# Patient Record
Sex: Male | Born: 1955 | Race: Black or African American | Hispanic: No | Marital: Single | Smoking: Former smoker
Health system: Southern US, Community
[De-identification: ages and names within clinical notes are randomized; demographics above are authoritative.]

## PROBLEM LIST (undated history)

## (undated) DIAGNOSIS — K219 Gastro-esophageal reflux disease without esophagitis: Secondary | ICD-10-CM

## (undated) DIAGNOSIS — J42 Unspecified chronic bronchitis: Secondary | ICD-10-CM

## (undated) DIAGNOSIS — N189 Chronic kidney disease, unspecified: Secondary | ICD-10-CM

## (undated) DIAGNOSIS — N400 Enlarged prostate without lower urinary tract symptoms: Secondary | ICD-10-CM

## (undated) DIAGNOSIS — M1990 Unspecified osteoarthritis, unspecified site: Secondary | ICD-10-CM

## (undated) DIAGNOSIS — Z973 Presence of spectacles and contact lenses: Secondary | ICD-10-CM

## (undated) DIAGNOSIS — I471 Supraventricular tachycardia, unspecified: Secondary | ICD-10-CM

## (undated) DIAGNOSIS — Z8739 Personal history of other diseases of the musculoskeletal system and connective tissue: Secondary | ICD-10-CM

## (undated) DIAGNOSIS — I491 Atrial premature depolarization: Secondary | ICD-10-CM

## (undated) DIAGNOSIS — I498 Other specified cardiac arrhythmias: Secondary | ICD-10-CM

## (undated) DIAGNOSIS — R972 Elevated prostate specific antigen [PSA]: Secondary | ICD-10-CM

## (undated) DIAGNOSIS — I10 Essential (primary) hypertension: Secondary | ICD-10-CM

## (undated) DIAGNOSIS — Z8679 Personal history of other diseases of the circulatory system: Secondary | ICD-10-CM

## (undated) DIAGNOSIS — J189 Pneumonia, unspecified organism: Secondary | ICD-10-CM

## (undated) DIAGNOSIS — N183 Chronic kidney disease, stage 3 unspecified: Secondary | ICD-10-CM

## (undated) HISTORY — PX: BACK SURGERY: SHX140

## (undated) HISTORY — PX: COLONOSCOPY: SHX174

## (undated) HISTORY — PX: TONSILLECTOMY: SUR1361

## (undated) HISTORY — PX: ROTATOR CUFF REPAIR: SHX139

## (undated) HISTORY — PX: PROSTATE BIOPSY: SHX241

## (undated) HISTORY — PX: CARDIAC ELECTROPHYSIOLOGY STUDY AND ABLATION: SHX1294

---

## 2008-04-05 HISTORY — PX: COLONOSCOPY: SHX174

## 2010-04-05 HISTORY — PX: UMBILICAL HERNIA REPAIR: SHX196

## 2012-08-14 HISTORY — PX: POSTERIOR FUSION LUMBAR SPINE: SUR632

## 2014-07-19 HISTORY — PX: CARDIAC ELECTROPHYSIOLOGY STUDY AND ABLATION: SHX1294

## 2015-04-22 HISTORY — PX: POSTERIOR FUSION LUMBAR SPINE, MINIMALLY INVASIVE: SUR633

## 2016-12-21 HISTORY — PX: SATURATION BIOPSY OF PROSTATE: SHX2375

## 2019-08-08 ENCOUNTER — Ambulatory Visit (INDEPENDENT_AMBULATORY_CARE_PROVIDER_SITE_OTHER): Payer: Self-pay

## 2019-08-08 ENCOUNTER — Ambulatory Visit
Admission: EM | Admit: 2019-08-08 | Discharge: 2019-08-08 | Disposition: A | Discharge status: Home / Self Care | Payer: Self-pay

## 2019-08-08 DIAGNOSIS — S62345A Nondisplaced fracture of base of fourth metacarpal bone, left hand, initial encounter for closed fracture: Secondary | ICD-10-CM

## 2019-08-08 DIAGNOSIS — M79645 Pain in left finger(s): Secondary | ICD-10-CM

## 2019-08-08 DIAGNOSIS — S62347A Nondisplaced fracture of base of fifth metacarpal bone. left hand, initial encounter for closed fracture: Secondary | ICD-10-CM

## 2019-08-08 DIAGNOSIS — M25561 Pain in right knee: Secondary | ICD-10-CM

## 2019-08-08 HISTORY — DX: Benign prostatic hyperplasia without lower urinary tract symptoms: N40.0

## 2019-08-08 HISTORY — DX: Gastro-esophageal reflux disease without esophagitis: K21.9

## 2019-08-08 HISTORY — DX: Essential (primary) hypertension: I10

## 2019-08-08 MED ORDER — TAMSULOSIN HCL 0.4 MG PO CAPS: 0.4000 mg | ORAL_CAPSULE | Freq: Every day | ORAL | 0 refills | Status: AC

## 2019-08-08 MED ORDER — ALBUTEROL SULFATE HFA 108 (90 BASE) MCG/ACT IN AERS: 1.0000 | INHALATION_SPRAY | Freq: Four times a day (QID) | RESPIRATORY_TRACT | 0 refills | Status: AC | PRN

## 2019-08-08 MED ORDER — LISINOPRIL 10 MG PO TABS: 10.0000 mg | ORAL_TABLET | Freq: Every day | ORAL | 0 refills | Status: DC
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
## 2019-08-08 NOTE — Discharge Instructions (Signed)
Please go to [facility] for splinting and further evaluation.  Medicines refilled, please call with verapamil dosage so I can also refill this.

## 2019-08-10 ENCOUNTER — Telehealth: Payer: Self-pay | Admitting: Emergency Medicine

## 2019-08-10 MED ORDER — VERAPAMIL HCL 120 MG PO TABS: 120.0000 mg | ORAL_TABLET | Freq: Once | ORAL | 0 refills | Status: DC

## 2019-08-10 NOTE — Telephone Encounter (Signed)
Patient called with the dose of verapamil: 240mg .  Will start at 120mg  as patient has been off of this medication greater than 3 weeks.  Pharmacy verified by nursing staff inform patient follow-up needs to be pursued with PCP for additional refills.  Patient verbalized understanding, no additional questions at this time.

## 2020-10-08 ENCOUNTER — Telehealth: Payer: Self-pay

## 2020-10-08 NOTE — Telephone Encounter (Signed)
NOTES ON FILE FROM [facility] [phone] SENT REFERRAL TO SCHEDULING

## 2020-12-04 ENCOUNTER — Other Ambulatory Visit: Payer: Self-pay | Admitting: Nephrology

## 2020-12-04 DIAGNOSIS — N1832 Chronic kidney disease, stage 3b: Secondary | ICD-10-CM

## 2020-12-11 ENCOUNTER — Ambulatory Visit
Admission: RE | Admit: 2020-12-11 | Discharge: 2020-12-11 | Disposition: A | Discharge status: Home / Self Care | Payer: Medicare Other | Source: Ambulatory Visit | Attending: Nephrology | Admitting: Nephrology

## 2020-12-11 DIAGNOSIS — N1832 Chronic kidney disease, stage 3b: Secondary | ICD-10-CM

## 2020-12-17 ENCOUNTER — Ambulatory Visit (HOSPITAL_BASED_OUTPATIENT_CLINIC_OR_DEPARTMENT_OTHER): Payer: Self-pay | Admitting: Cardiology

## 2020-12-22 ENCOUNTER — Ambulatory Visit (INDEPENDENT_AMBULATORY_CARE_PROVIDER_SITE_OTHER): Payer: Medicare Other | Admitting: Cardiology

## 2020-12-22 ENCOUNTER — Encounter: Payer: Self-pay | Admitting: Cardiology

## 2020-12-22 ENCOUNTER — Other Ambulatory Visit: Payer: Self-pay | Admitting: Nephrology

## 2020-12-22 ENCOUNTER — Other Ambulatory Visit: Payer: Self-pay

## 2020-12-22 DIAGNOSIS — N281 Cyst of kidney, acquired: Secondary | ICD-10-CM

## 2020-12-22 DIAGNOSIS — E785 Hyperlipidemia, unspecified: Secondary | ICD-10-CM | POA: Insufficient documentation

## 2020-12-22 DIAGNOSIS — N1831 Chronic kidney disease, stage 3a: Secondary | ICD-10-CM

## 2020-12-22 DIAGNOSIS — E7800 Pure hypercholesterolemia, unspecified: Secondary | ICD-10-CM | POA: Diagnosis not present

## 2020-12-22 DIAGNOSIS — I491 Atrial premature depolarization: Secondary | ICD-10-CM

## 2020-12-22 NOTE — Patient Instructions (Signed)
Medication Instructions:  The current medical regimen is effective;  continue present plan and medications.  *If you need a refill on your cardiac medications before your next appointment, please call your pharmacy*  Follow-Up: At [facility], you and your health needs are our priority.  As part of our continuing mission to provide you with exceptional heart care, we have created designated Provider Care Teams.  These Care Teams include your primary Cardiologist (physician) and Advanced Practice Providers (APPs -  Physician Assistants and Nurse Practitioners) who all work together to provide you with the care you need, when you need it.  We recommend signing up for the patient portal called "MyChart".  Sign up information is provided on this After Visit Summary.  MyChart is used to connect with patients for Virtual Visits (Telemedicine).  Patients are able to view lab/test results, encounter notes, upcoming appointments, etc.  Non-urgent messages can be sent to your provider as well.   To learn more about what you can do with MyChart, go to [email or web address].    Follow up as needed with Dr Skains  Thank you for choosing [facility]!!      

## 2020-12-22 NOTE — Assessment & Plan Note (Signed)
He is seeing nephrology.

## 2020-12-22 NOTE — Progress Notes (Signed)
Cardiology Office Note:    Date:  12/22/2020   ID:  Dylan Berg, DOB Jul 29, 1955, MRN [id number]  PCP:  Verlon Au, MD   [facility] Providers Cardiologist:  None     Referring MD: Verlon Au, MD    History of Present Illness:    Dylan Berg is a 65 y.o. male here for the evaluation of irregular heart beat at the request of Payton Emerald, MD.  EKG shows from outside office sinus rhythm with frequent PACs and bigeminy pattern normal heart rate.  He is asymptomatic with this.  He is not feeling any shortness of breath no chest pain no syncope no palpitations.  During that encounter, he had just received some bad news about one of his sisters who had died in [place].  He now resides here in [place] from [place].  He denies any caffeine use or excessive caffeine use.  He does not drink coffee.  He stopped drinking sodas.  Lab work includes creatinine of 1.76 elevated, total cholesterol 238 LDL 164 HDL 48 triglycerides 143.   Past Medical History:  Diagnosis Date   Acid reflux    Enlarged prostate    Hypertension     Past Surgical History:  Procedure Laterality Date   BACK SURGERY     ROTATOR CUFF REPAIR      Current Medications: Current Meds  Medication Sig   albuterol (VENTOLIN HFA) 108 (90 Base) MCG/ACT inhaler Inhale 1-2 puffs into the lungs every 6 (six) hours as needed for wheezing or shortness of breath.   amLODipine (NORVASC) 10 MG tablet Take 10 mg by mouth daily.   famotidine (PEPCID) 20 MG tablet Take 20 mg by mouth 2 (two) times daily.   hydrochlorothiazide (HYDRODIURIL) 25 MG tablet Take 25 mg by mouth daily.   tamsulosin (FLOMAX) 0.4 MG CAPS capsule Take 1 capsule (0.4 mg total) by mouth daily.   verapamil (CALAN) 120 MG tablet Take 1 tablet (120 mg total) by mouth once for 1 dose. Unsure dosage     Allergies:   Patient has no known allergies.   Social History   Socioeconomic History   Marital status: Single     Spouse name: Not on file   Number of children: Not on file   Years of education: Not on file   Highest education level: Not on file  Occupational History   Not on file  Tobacco Use   Smoking status: Some Days   Smokeless tobacco: Never  Substance and Sexual Activity   Alcohol use: Yes    Comment: occ   Drug use: Not Currently   Sexual activity: Yes  Other Topics Concern   Not on file  Social History Narrative   Not on file   Social Determinants of Health   Financial Resource Strain: Not on file  Food Insecurity: Not on file  Transportation Needs: Not on file  Physical Activity: Not on file  Stress: Not on file  Social Connections: Not on file     Family History: The patient's family history is negative for Heart attack. Mother bone cancer  ROS:   Please see the history of present illness.     All other systems reviewed and are negative.  EKGs/Labs/Other Studies Reviewed:    The following studies were reviewed today: Outside office note reviewed.  Outside lab work reviewed.  [place] EKG reviewed and interpreted.  EKG:  EKG is  ordered today.  The ekg ordered today demonstrates sinus rhythm 62 isolated  PAC with no other abnormalities.  Recent Labs: No results found for requested labs within last 8760 hours.  Recent Lipid Panel No results found for: CHOL, TRIG, HDL, CHOLHDL, VLDL, LDLCALC, LDLDIRECT   Risk Assessment/Calculations:          Physical Exam:    VS:  BP 112/60 (BP Location: Left Arm, Patient Position: Sitting, Cuff Size: Normal)   Pulse 62   Ht 6' (1.829 m)   Wt 220 lb (99.8 kg)   SpO2 95%   BMI 29.84 kg/m     Wt Readings from Last 3 Encounters:  12/22/20 220 lb (99.8 kg)     GEN:  Well nourished, well developed in no acute distress HEENT: Normal NECK: No JVD; No carotid bruits LYMPHATICS: No lymphadenopathy CARDIAC: RRR, no murmurs, rubs, gallops RESPIRATORY:  Clear to auscultation without rales, wheezing or rhonchi  ABDOMEN: Soft,  non-tender, non-distended MUSCULOSKELETAL:  No edema; No deformity  SKIN: Warm and dry NEUROLOGIC:  Alert and oriented x 3 PSYCHIATRIC:  Normal affect   ASSESSMENT:    1. PAC (premature atrial contraction)   2. Pure hypercholesterolemia   3. 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  Current EKG shows sinus rhythm with no significant PACs.  1 PAC noted on ECG.  Overall this is benign.  He is not having symptoms with this.  He is not have any murmurs on exam.  No prior cardiac issues.  This could have been triggered by increasing stress from his sister's death, catecholamines for instance.  He knows to avoid excessive caffeine.  Exercise.  He is non-smoker.  If symptoms worsen or become more worrisome, we can always consider monitoring him or checking echocardiogram for further structure and function.  Hyperlipidemia No early family history of coronary disease.  Continue with diet, exercise.  Could consider coronary calcium score in the future to help determine whether or not statin therapy would be beneficial for him.  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        Medication Adjustments/Labs and Tests Ordered: Current medicines are reviewed at length with the patient today.  Concerns regarding medicines are outlined above.  No orders of the defined types were placed in this encounter.  No orders of the defined types were placed in this encounter.   Patient Instructions  Medication Instructions:  The current medical regimen is effective;  continue present plan and medications.  *If you need a refill on your cardiac medications before your next appointment, please call your pharmacy*  Follow-Up: At [facility], you and your health  needs are our priority.  As part of our continuing mission to provide you with exceptional heart care, we have created designated Provider Care Teams.  These Care Teams include your primary Cardiologist (physician) and Advanced Practice Providers (APPs -  Physician Assistants and Nurse Practitioners) who all work together to provide you with the care you need, when you need it.  We recommend signing up for the patient portal called "MyChart".  Sign up information is provided on this After Visit Summary.  MyChart is used to connect with patients for Virtual Visits (Telemedicine).  Patients are able to view lab/test results, encounter notes, upcoming appointments, etc.  Non-urgent messages can be sent to  your provider as well.   To learn more about what you can do with MyChart, go to [email or web address].    Follow up as needed with Dr Anne Fu.  Thank you for choosing [facility]!!     Signed, Donato Schultz, MD  12/22/2020 11:04 AM    [facility]

## 2020-12-22 NOTE — Assessment & Plan Note (Signed)
No early family history of coronary disease.  Continue with diet, exercise.  Could consider coronary calcium score in the future to help determine whether or not statin therapy would be beneficial for him.

## 2020-12-22 NOTE — Assessment & Plan Note (Signed)
Frequent PACs were noted on his original EKG, atrial bigeminy pattern.  Current EKG shows sinus rhythm with no significant PACs.  1 PAC noted on ECG.  Overall this is benign.  He is not having symptoms with this.  He is not have any murmurs on exam.  No prior cardiac issues.  This could have been triggered by increasing stress from his sister's death, catecholamines for instance.  He knows to avoid excessive caffeine.  Exercise.  He is non-smoker.  If symptoms worsen or become more worrisome, we can always consider monitoring him or checking echocardiogram for further structure and function.

## 2021-01-11 ENCOUNTER — Other Ambulatory Visit: Payer: Medicare Other

## 2021-01-14 NOTE — Addendum Note (Signed)
Addended by: Louanne Belton, Margean Korell A on: 01/14/2021 07:28 AM   Modules accepted: Orders

## 2021-02-13 ENCOUNTER — Ambulatory Visit
Admission: RE | Admit: 2021-02-13 | Discharge: 2021-02-13 | Disposition: A | Discharge status: Home / Self Care | Payer: Medicare Other | Source: Ambulatory Visit | Attending: Nephrology | Admitting: Nephrology

## 2021-02-13 ENCOUNTER — Other Ambulatory Visit: Payer: Self-pay

## 2021-02-13 DIAGNOSIS — N281 Cyst of kidney, acquired: Secondary | ICD-10-CM

## 2021-03-06 ENCOUNTER — Other Ambulatory Visit: Payer: Self-pay

## 2021-03-06 ENCOUNTER — Ambulatory Visit
Admission: RE | Admit: 2021-03-06 | Discharge: 2021-03-06 | Disposition: A | Discharge status: Home / Self Care | Payer: Medicare Other | Source: Ambulatory Visit | Attending: Nephrology | Admitting: Nephrology

## 2021-03-06 MED ORDER — GADOBENATE DIMEGLUMINE 529 MG/ML IV SOLN
20.0000 mL | Freq: Once | INTRAVENOUS | Status: AC | PRN
  Administered 2021-03-06: 20 mL via INTRAVENOUS

## 2021-09-13 ENCOUNTER — Emergency Department (HOSPITAL_COMMUNITY)
Admission: EM | Admit: 2021-09-13 | Discharge: 2021-09-13 | Discharge status: Against Medical Advice | Payer: Medicare Other | Attending: Physician Assistant | Admitting: Physician Assistant

## 2021-09-13 ENCOUNTER — Encounter (HOSPITAL_COMMUNITY): Payer: Self-pay

## 2021-09-13 ENCOUNTER — Emergency Department (HOSPITAL_COMMUNITY): Payer: Medicare Other

## 2021-09-13 ENCOUNTER — Other Ambulatory Visit: Payer: Self-pay

## 2021-09-13 DIAGNOSIS — M25561 Pain in right knee: Secondary | ICD-10-CM | POA: Diagnosis not present

## 2021-09-13 DIAGNOSIS — W208XXA Other cause of strike by thrown, projected or falling object, initial encounter: Secondary | ICD-10-CM | POA: Insufficient documentation

## 2021-09-13 DIAGNOSIS — M25511 Pain in right shoulder: Secondary | ICD-10-CM | POA: Diagnosis not present

## 2021-09-13 DIAGNOSIS — Y9389 Activity, other specified: Secondary | ICD-10-CM | POA: Diagnosis not present

## 2021-09-13 DIAGNOSIS — Y990 Civilian activity done for income or pay: Secondary | ICD-10-CM | POA: Diagnosis not present

## 2021-09-13 DIAGNOSIS — Z5321 Procedure and treatment not carried out due to patient leaving prior to being seen by health care provider: Secondary | ICD-10-CM | POA: Insufficient documentation

## 2021-09-13 DIAGNOSIS — Y9289 Other specified places as the place of occurrence of the external cause: Secondary | ICD-10-CM | POA: Diagnosis not present

## 2021-09-13 NOTE — ED Notes (Signed)
Patient states he can no longer stay and is deciding to leave

## 2021-09-13 NOTE — ED Triage Notes (Signed)
Patient was at work and had a scaffel with sheet rock and 2x4 fall on right side.  Reports pain in right shoulder, right knee and knock noted in right bicep and having a hard time using right arm.

## 2021-10-02 ENCOUNTER — Encounter: Payer: Self-pay | Admitting: Surgery

## 2021-10-02 ENCOUNTER — Ambulatory Visit (INDEPENDENT_AMBULATORY_CARE_PROVIDER_SITE_OTHER): Payer: Worker's Compensation | Admitting: Surgery

## 2021-10-02 VITALS — BP 120/82 | HR 96 | Ht 72.0 in | Wt 220.0 lb

## 2021-10-02 DIAGNOSIS — Y990 Civilian activity done for income or pay: Secondary | ICD-10-CM

## 2021-10-02 DIAGNOSIS — M25561 Pain in right knee: Secondary | ICD-10-CM | POA: Diagnosis not present

## 2021-10-02 DIAGNOSIS — S8991XA Unspecified injury of right lower leg, initial encounter: Secondary | ICD-10-CM | POA: Diagnosis not present

## 2021-10-02 DIAGNOSIS — S4991XA Unspecified injury of right shoulder and upper arm, initial encounter: Secondary | ICD-10-CM | POA: Diagnosis not present

## 2021-10-05 ENCOUNTER — Telehealth: Payer: Self-pay | Admitting: Surgery

## 2021-10-05 NOTE — Telephone Encounter (Signed)
Pt called requesting pain medication for his right knee pains. Pt asking for pain medication to go to [facility] [address]. Please call pt at  [phone].

## 2021-10-05 NOTE — Telephone Encounter (Signed)
Please advise 

## 2021-10-13 NOTE — Progress Notes (Unsigned)
Office Visit Note   Patient: Dylan Berg           Date of Birth: [birth year]           MRN: [id number] Visit Date: 10/02/2021              Requested by: Verlon Au, MD [phone] [place],  [place] [address] PCP: Verlon Au, MD   Assessment & Plan: Visit Diagnoses:  1. Right shoulder injury, initial encounter   2. Acute pain of right knee   3. Injury of right knee, initial encounter   4. Work related injury     Plan: With patient's work-related injuries and the mechanism of injury I will schedule MRI right shoulder and MRI right knee.Marland Kitchen  He was put in a knee immobilizer.  He can be weightbearing as tolerated with crutches.  Given a note taking him out of work x2 to 3 weeks.  We will follow-up with Dr. Ophelia Charter in 2 weeks for recheck and to discuss results of the studies.  Follow-Up Instructions: Return in about 2 weeks (around 10/16/2021) for with dr yates to review mri scans.   Orders:  Orders Placed This Encounter  Procedures   MR SHOULDER RIGHT WO CONTRAST   MR Knee Right w/o contrast   No orders of the defined types were placed in this encounter.     Procedures: No procedures performed   Clinical Data: No additional findings.   Subjective: Chief Complaint  Patient presents with   Right Arm - Pain    HPI 66 year old black male comes in today with complaints of right shoulder and right knee pain.  Patient employed doing [employer] and suffered a work-related injury September 11, 2021 where he states that he was pulling a scaffold with his right arm and states that the back wheels fell off causing a sudden jerking painful motion with his right shoulder and then states that a load of sheet rock fell on top of him.  Patient immediately had pain in his right shoulder and his right knee.  Difficulty using his arm and weightbearing on the right leg.  He went to the [facility], ED September 13, 2021 and had x-rays but per their report he "left without being  seen".  I asked patient about this and he does admit to leave the emergency department without treatment.  Right shoulder x-rays showed:  EXAM: RIGHT SHOULDER - 2+ VIEW COMPARISON: None Available. FINDINGS: No evidence for an acute fracture. No evidence for shoulder separation or dislocation. Calcific tendinitis of the rotator cuff evident. Likely undersurface spur of the acromion. IMPRESSION: 1. No acute bony abnormality. 2. Calcific tendinitis of the rotator cuff. Likely undersurface spur of the acromion. Electronically Signed   Right knee x-ray:  EXAM: RIGHT KNEE - COMPLETE 4+ VIEW   COMPARISON:  None Available.   FINDINGS: No fracture of the proximal tibia or distal femur. Patella is normal. No joint effusion.   IMPRESSION: No fracture or dislocation.     Electronically Signed   By: Genevive Bi M.D.   On: 09/13/2021 17:35  Patient is he has pain in the right upper arm where he feels like there is a "knot".  Difficulty with overhead activity.  Pain when he flexes his right elbow.  Right knee is swollen and feels like it gives way.  Pain when he is weightbearing.  Is currently taking Tylenol.  Patient was also seen by his primary care provider September 22, 2021 and  referred here to our office. Review of Systems No current complaints of cardiopulmonary GI/GU  Objective: Vital Signs: BP 120/82   Pulse 96   Ht 6' (1.829 m)   Wt 220 lb (99.8 kg)   BMI 29.84 kg/m   Physical Exam HENT:     Head: Normocephalic and atraumatic.     Nose: Nose normal.  Pulmonary:     Effort: No respiratory distress.  Musculoskeletal:     Comments: Gait is antalgic.  Cervical spine unremarkable.  Right shoulder he has decreased range of motion secondary to pain.  Marked positive impingement test.  He does have a proximal biceps deformity.  Pain with resisted elbow flexion.  Negative drop arm test.  Negative logroll bilateral hips.  Right knee range of motion about 5 to 85 degrees with  pain.  Does have right knee swelling with moderate size effusion.  Medial lateral joint line tenderness.  Difficult to assess ligament stability due to knee pain.  Calf is nontender.  Neurological:     Mental Status: He is alert.     Ortho Exam  Specialty Comments:  No specialty comments available.  Imaging: No results found.   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

## 2021-10-14 ENCOUNTER — Other Ambulatory Visit: Payer: Self-pay | Admitting: Surgery

## 2021-10-14 ENCOUNTER — Telehealth: Payer: Self-pay | Admitting: Orthopaedic Surgery

## 2021-10-14 NOTE — Telephone Encounter (Signed)
Pt called requesting a script for upcoming appt for MRI pt states he need meds to keep him calm. Please send to [facility] [address]. Please call pt at [phone].

## 2021-10-15 ENCOUNTER — Other Ambulatory Visit: Payer: Self-pay | Admitting: Surgery

## 2021-10-15 MED ORDER — LORAZEPAM 0.5 MG PO TABS: ORAL_TABLET | ORAL | 0 refills | Status: DC

## 2021-10-16 NOTE — Telephone Encounter (Signed)
Tried calling to advise. No answer.  

## 2021-10-17 ENCOUNTER — Ambulatory Visit
Admission: RE | Admit: 2021-10-17 | Discharge: 2021-10-17 | Disposition: A | Discharge status: Home / Self Care | Payer: Medicare Other | Source: Ambulatory Visit | Attending: Surgery | Admitting: Surgery

## 2021-10-17 DIAGNOSIS — Y990 Civilian activity done for income or pay: Secondary | ICD-10-CM

## 2021-10-17 DIAGNOSIS — S4991XA Unspecified injury of right shoulder and upper arm, initial encounter: Secondary | ICD-10-CM

## 2021-10-17 DIAGNOSIS — S8991XA Unspecified injury of right lower leg, initial encounter: Secondary | ICD-10-CM

## 2021-11-04 ENCOUNTER — Ambulatory Visit (INDEPENDENT_AMBULATORY_CARE_PROVIDER_SITE_OTHER): Payer: Worker's Compensation | Admitting: Orthopaedic Surgery

## 2021-11-04 ENCOUNTER — Encounter: Payer: Self-pay | Admitting: Orthopaedic Surgery

## 2021-11-04 DIAGNOSIS — S46219A Strain of muscle, fascia and tendon of other parts of biceps, unspecified arm, initial encounter: Secondary | ICD-10-CM | POA: Diagnosis not present

## 2021-11-04 NOTE — Progress Notes (Signed)
Office Visit Note   Patient: Dylan Berg           Date of Birth: 1955-09-25           MRN: [id number] Visit Date: 11/04/2021              Requested by: Verlon Au, MD [id number] S. [address] [place],  [place] [address] PCP: Verlon Au, MD   Assessment & Plan: Visit Diagnoses:  1. Tear of biceps tendon     Plan: Patient had an injection in his shoulder by Dr. Darrelyn Hillock who is the treating physician for Worker's Comp.  We reviewed the MRI scan.  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  Discussed with patient he can follow-up with Dr. Darrelyn Hillock as scheduled.  We reviewed MRI of his shoulder findings of the MRI of his knee.  Currently no follow-up scheduled with me.  Follow-Up Instructions: No follow-ups on file.   Orders:  No orders of the defined types were placed in this encounter.  No orders of the defined types were placed in this encounter.     Procedures: No procedures performed   Clinical Data: No additional findings.   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  Patient's had an MRI scan which shows biceps tendon tear and patient states his biceps muscle migrated distally at the time of the scaffold falling and injuring 09/11/21.  Patient originally referred to our practice by PCP and saw my PA Zonia Kief, PA-C.  Patient was referred by Worker's Comp. to Dr. Darrelyn Hillock at [facility].  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  Subscap muscle still looks good.  He states he is having knee pain he is ambulatory with a cane.  History of stage III  kidney disease PACs hyperlipidemia.  Review of Systems all the systems noncontributory to HPI.   Objective: Vital Signs: BP 115/69   Pulse 60   Ht 6' (1.829 m)   Wt 220 lb (99.8 kg)   BMI 29.84 kg/m   Physical Exam Constitutional:      Appearance: He is well-developed.  HENT:     Head: Normocephalic and atraumatic.     Right Ear: External ear normal.     Left Ear: External ear normal.  Eyes:     Pupils: Pupils are equal, round, and reactive to light.  Neck:     Thyroid: No thyromegaly.     Trachea: No tracheal deviation.  Cardiovascular:     Rate and Rhythm: Normal rate.  Pulmonary:     Effort: Pulmonary effort is normal.     Breath sounds: No wheezing.  Abdominal:     General: Bowel sounds are normal.     Palpations: Abdomen is soft.  Musculoskeletal:     Cervical back: Neck supple.  Skin:    General: Skin is warm and dry.     Capillary Refill: Capillary refill takes less than 2 seconds.  Neurological:     Mental Status: He is alert and oriented to person, place, and time.  Psychiatric:  Behavior: Behavior normal.        Thought Content: Thought content normal.        Judgment: Judgment normal.     Ortho Exam right arm positive drop arm test positive impingement.  Distal prominence of biceps muscle on the right normal on the left.  Crepitus with right knee range of motion.  Specialty Comments:  No specialty comments available.  Imaging: Narrative & Impression  CLINICAL DATA:  Shoulder trauma.  Right shoulder pain.   EXAM: MRI OF THE RIGHT SHOULDER WITHOUT CONTRAST   TECHNIQUE: Multiplanar, multisequence MR imaging of the shoulder was performed. No intravenous contrast was administered.   COMPARISON:  Radiographs 09/13/2021   FINDINGS: Rotator cuff: Full-thickness full width rupture of the supraspinatus retracted back to the plane of the glenoid. Full-thickness, nearly full width tear of the infraspinatus tendon with only a small inferior  portion of the infraspinatus still attaching to the greater tuberosity. Most of the infraspinatus tendon is torn and retracted.   Moderate subscapularis tendinopathy.   Muscles:  Supraspinatus and infraspinatus muscular atrophy.   Biceps long head: Nonvisualization of the biceps, thought to be ruptured.   Acromioclavicular Joint: Mild spurring and mild subcortical marrow edema compatible with mild degenerative AC joint arthropathy. Type II acromion. As expected, there is fluid in the subacromial subdeltoid bursa communicating with the joint.   Glenohumeral Joint: The humeral head is subluxed superiorly against the acromion. Chondral defect posteriorly in the glenoid on image 13 series 3 with underlying subcortical marrow edema. Mild chondral thinning along the humeral head.   Labrum: Indistinct and degenerative aided posterior labrum, degenerative tearing of the labrum is not readily excluded.   Bones: No significant extra-articular osseous abnormalities identified.   Other: No supplemental non-categorized findings.   IMPRESSION: 1. Full-thickness full width retracted rupture of the supraspinatus, with full-thickness partial width tearing of the infraspinatus tendon. Only a small portion of the infraspinatus tendon inferiorly still attaches to the greater tuberosity. Atrophy of the supraspinatus and infraspinatus muscles. 2. Moderate subscapularis tendinopathy. 3. Ruptured long head of the biceps. 4. Mild degenerative AC joint arthropathy and moderate degenerative glenohumeral arthropathy. Focal osteochondral lesion posteriorly along the glenoid with overlying full-thickness chondral loss and underlying subcortical marrow edema. 5. Degenerated and indistinct posterior labrum, cannot exclude degenerative tear.     Electronically Signed   By: Gaylyn Rong M.D.   On: 10/19/2021 15:32   Narrative & Impression  CLINICAL DATA:  Right knee pain and swelling,  scaffolding fell onto the patient's knee.   EXAM: MRI OF THE RIGHT KNEE WITHOUT CONTRAST   TECHNIQUE: Multiplanar, multisequence MR imaging of the knee was performed. No intravenous contrast was administered.   COMPARISON:  Radiograph 09/13/2021   FINDINGS: MENISCI   Medial meniscus:  Unremarkable   Lateral meniscus:  Unremarkable   LIGAMENTS   Cruciates:  Unremarkable   Collaterals: Mild proximal popliteus tendinopathy, otherwise unremarkable.   CARTILAGE   Patellofemoral: Marked chondral thinning along the femoral trochlear groove with subcortical marrow edema and confluent degenerative subcortical cystic lesions laterally in the femoral trochlear groove with some degree of cortical irregularity for example on image 17-18 of series 4. There is mild chondral thinning along the posterior patellar ridge.   Medial:  Unremarkable   Lateral:  Unremarkable   Joint: Moderate knee effusion. No loose osteochondral fragment identified.   Popliteal Fossa:  Small Baker's cyst.   Extensor Mechanism:  Unremarkable   Bones: Nonspecific mild endosteal edema along the patella, without a  discrete fracture.   Other: No supplemental non-categorized findings.   IMPRESSION: 1. 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 There is some subtle endosteal edema the patella although the patellar articular cartilage is much better preserved. No acute fracture is identified. 2. Moderate knee effusion and small Baker's cyst. 3. Mild proximal popliteus tendinopathy.     Electronically Signed   By: Gaylyn Rong M.D.   On: 10/19/2021 16:01         PMFS History: Patient Active Problem List   Diagnosis Date Noted   Tear of biceps tendon 11/04/2021   PAC (premature atrial contraction) 12/22/2020   Hyperlipidemia 12/22/2020    CKD stage G3a/A1, GFR 45-59 and albumin creatinine ratio <30 mg/g (HCC) 12/22/2020   Past Medical History:  Diagnosis Date   Acid reflux    Enlarged prostate    Hypertension     Family History  Problem Relation Age of Onset   Heart attack Neg Hx     Past Surgical History:  Procedure Laterality Date   BACK SURGERY     ROTATOR CUFF REPAIR     Social History   Occupational History   Not on file  Tobacco Use   Smoking status: Some Days   Smokeless tobacco: Never  Vaping Use   Vaping Use: Never used  Substance and Sexual Activity   Alcohol use: Yes    Comment: occ   Drug use: Not Currently   Sexual activity: Yes

## 2021-12-24 ENCOUNTER — Telehealth: Payer: Self-pay | Admitting: *Deleted

## 2021-12-24 NOTE — Telephone Encounter (Signed)
Spoke with pt regarding appointment.  He will call back and schedule an in-office appointment for surgical clearance.      Pre-operative Risk Assessment    Patient Name: Dylan Berg  DOB: 02/22/1956 MRN: [id number]      Request for Surgical Clearance    Procedure:   RIGHT REVERSE TSA  Date of Surgery:  Clearance TBD                                 Surgeon:  DR. Esmond Plants Surgeon's Group or Practice Name:  [facility] Phone number:  [phone] Fax number:  [phone]   Type of Clearance Requested:   - Medical    Type of Anesthesia:  General    Additional requests/questions:    [name]   12/24/2021, 3:24 PM

## 2021-12-27 NOTE — Progress Notes (Signed)
Office Visit    Patient Name: Dylan Berg Date of Encounter: 12/27/2021  Primary Care Provider:  Drue Flirt, MD Primary Cardiologist:  None Primary Electrophysiologist: None  Chief Complaint    Dylan Berg is a 66 y.o. male with PMH of irregular heartbeat, CKD stage III and HTN who presents today for surgical clearance for right reverse TSA.  Past Medical History    Past Medical History:  Diagnosis Date   Acid reflux    Enlarged prostate    Hypertension    Past Surgical History:  Procedure Laterality Date   BACK SURGERY     ROTATOR CUFF REPAIR      Allergies  No Known Allergies  History of Present Illness    Dylan Berg  is a 66 year old male with the above mention past medical history who presents today for surgical clearance of right shoulder surgery.  Dylan Berg was seen initially by Dr. Marlou Berg on 12/2020 following referral from PCP regarding irregular heartbeat.  EKG during visit shows sinus rhythm with frequent PACs and bigeminy.  Patient was noted to be asymptomatic and denies shortness of breath, chest pain.  He denied excess caffeine in his diet was encouraged to exercise.  During visit patient had just received news that his sister had passed away.  It was unclear if this new psychologist presenting PACs and decision was made to monitor in the future with echo and coronary CT if needed.  Dylan Berg presents today alone for surgical clearance of upcoming total shoulder arthroplasty.  Since last being seen in the office patient reports he has been doing well from cardiac perspective and denies any chest pain or shortness of breath.  He is tolerating his medications without adverse effects and is staying active physically.  He does report an incident at work where a scaffolding fell on his back and right knee.  He is currently having rehabilitation for his right knee pain.  He has blood pressure today is well controlled at 130/68 and heart rate  is 65 bpm..  Patient denies chest pain, palpitations, dyspnea, PND, orthopnea, nausea, vomiting, dizziness, syncope, edema, weight gain, or early satiety.   Home Medications    Current Outpatient Medications  Medication Sig Dispense Refill   albuterol (VENTOLIN HFA) 108 (90 Base) MCG/ACT inhaler Inhale 1-2 puffs into the lungs every 6 (six) hours as needed for wheezing or shortness of breath. 18 g 0   amLODipine (NORVASC) 10 MG tablet Take 10 mg by mouth daily.     famotidine (PEPCID) 20 MG tablet Take 20 mg by mouth 2 (two) times daily.     hydrochlorothiazide (HYDRODIURIL) 25 MG tablet Take 25 mg by mouth daily.     LORazepam (ATIVAN) 0.5 MG tablet Take 1 tablet 1 hour before MRI and 1 immediately before scan if needed.  Must have a driver. 2 tablet 0   tamsulosin (FLOMAX) 0.4 MG CAPS capsule Take 1 capsule (0.4 mg total) by mouth daily. 60 capsule 0   verapamil (CALAN) 120 MG tablet Take 1 tablet (120 mg total) by mouth once for 1 dose. Unsure dosage 30 tablet 0   No current facility-administered medications for this visit.     Review of Systems  Please see the history of present illness.    (+) Right knee pain (+) Right shoulder pain  All other systems reviewed and are otherwise negative except as noted above.  Physical Exam    Wt Readings from Last 3 Encounters:  11/04/21 220 lb (99.8 kg)  10/02/21 220 lb (99.8 kg)  09/13/21 220 lb (99.8 kg)   RX:VQMGQ were no vitals filed for this visit.,There is no height or weight on file to calculate BMI.  Constitutional:      Appearance: Healthy appearance. Not in distress.  Neck:     Vascular: JVD normal.  Pulmonary:     Effort: Pulmonary effort is normal.     Breath sounds: No wheezing. No rales. Diminished in the bases Cardiovascular:     Normal rate. Regular rhythm. Normal S1. Normal S2.      Murmurs: There is no murmur.  Edema:    Peripheral edema absent.  Abdominal:     Palpations: Abdomen is soft non tender. There is no  hepatomegaly.  Skin:    General: Skin is warm and dry.  Neurological:     General: No focal deficit present.     Mental Status: Alert and oriented to person, place and time.     Cranial Nerves: Cranial nerves are intact.  EKG/LABS/Other Studies Reviewed    ECG personally reviewed by me today -sinus rhythm with sinus arrhythmia and atrial bigeminy consistent with previous EKG and rate of 65 bpm.   No results found for: "WBC", "HGB", "HCT", "MCV", "PLT" No results found for: "CREATININE", "BUN", "NA", "K", "CL", "CO2" No results found for: "ALT", "AST", "GGT", "ALKPHOS", "BILITOT" No results found for: "CHOL", "HDL", "LDLCALC", "LDLDIRECT", "TRIG", "CHOLHDL"  No results found for: "HGBA1C"  Assessment & Plan    1.  Surgical clearance: -Right shoulder arthroplasty The patient affirms he has been doing well without any new cardiac symptoms. They are able to achieve 6 METS without cardiac limitations. Therefore, based on ACC/AHA guidelines, the patient would be at acceptable risk for the planned procedure without further cardiovascular testing. The patient was advised that if he develops new symptoms prior to surgery to contact our office to arrange for a follow-up visit, and he verbalized understanding.   Dylan Berg perioperative risk of a major cardiac event is 0.4% according to the Revised Cardiac Risk Index (RCRI).  Therefore, he is at low risk for perioperative complications.   His functional capacity is good at 6.91 METs according to the Duke Activity Status Index (DASI). Recommendations: According to ACC/AHA guidelines, no further cardiovascular testing needed.  The patient may proceed to surgery at acceptable risk.   Antiplatelet and/or Anticoagulation Recommendations: Not applicable    2.  Premature atrial contractions: -During visit today showed atrial bigeminy -Patient is asymptomatic currently -We will have him wear 7-day ZIO monitor to evaluate possible atrial  arrhythmia  3.  HTN: -Patient's blood pressure today was well controlled at 130/68 -Continue HCTZ 25 mg and amlodipine 10 mg  4.  Stage III CKD: -Patient currently followed by nephrology  Disposition: Follow-up as needed     Medication Adjustments/Labs and Tests Ordered: Current medicines are reviewed at length with the patient today.  Concerns regarding medicines are outlined above.   Signed, Napoleon Form, Leodis Rains, NP 12/27/2021, 10:38 AM [facility]  Note:  This document was prepared using Dragon voice recognition software and may include unintentional dictation errors.

## 2021-12-28 ENCOUNTER — Encounter: Payer: Self-pay | Admitting: Nurse Practitioner

## 2021-12-28 ENCOUNTER — Ambulatory Visit (INDEPENDENT_AMBULATORY_CARE_PROVIDER_SITE_OTHER): Payer: Medicare Other

## 2021-12-28 ENCOUNTER — Ambulatory Visit: Payer: Medicare Other | Attending: Nurse Practitioner | Admitting: Nurse Practitioner

## 2021-12-28 VITALS — BP 130/68 | Ht 71.5 in | Wt 205.4 lb

## 2021-12-28 DIAGNOSIS — I10 Essential (primary) hypertension: Secondary | ICD-10-CM | POA: Diagnosis not present

## 2021-12-28 DIAGNOSIS — N1831 Chronic kidney disease, stage 3a: Secondary | ICD-10-CM

## 2021-12-28 DIAGNOSIS — I491 Atrial premature depolarization: Secondary | ICD-10-CM

## 2021-12-28 DIAGNOSIS — Z01810 Encounter for preprocedural cardiovascular examination: Secondary | ICD-10-CM | POA: Diagnosis not present

## 2021-12-28 NOTE — Patient Instructions (Signed)
Medication Instructions:   Your physician recommends that you continue on your current medications as directed. Please refer to the Current Medication list given to you today.   *If you need a refill on your cardiac medications before your next appointment, please call your pharmacy*   Lab Work:  None ordered.  If you have labs (blood work) drawn today and your tests are completely normal, you will receive your results only by: [place] (if you have MyChart) OR A paper copy in the mail If you have any lab test that is abnormal or we need to change your treatment, we will call you to review the results.   Testing/Procedures:  Bryn Gulling- Long Term Monitor Instructions  Your physician has requested you wear a ZIO patch monitor for 7 days.  This is a single patch monitor. [employer] supplies one patch monitor per enrollment. Additional stickers are not available. Please do not apply patch if you will be having a Nuclear Stress Test,  Echocardiogram, Cardiac CT, MRI, or Chest Xray during the period you would be wearing the  monitor. The patch cannot be worn during these tests. You cannot remove and re-apply the  ZIO XT patch monitor.  Your ZIO patch monitor will be mailed 3 day USPS to your address on file. It may take 3-5 days  to receive your monitor after you have been enrolled.  Once you have received your monitor, please review the enclosed instructions. Your monitor  has already been registered assigning a specific monitor serial # to you.  Billing and Patient Assistance Program Information  We have supplied [employer] with any of your insurance information on file for billing purposes. [employer] offers a sliding scale Patient Assistance Program for patients that do not have  insurance, or whose insurance does not completely cover the cost of the ZIO monitor.  You must apply for the Patient Assistance Program to qualify for this discounted rate.  To apply, please call [employer] at  [phone], select option 4, select option 2, ask to apply for  Patient Assistance Program. Theodore Demark will ask your household income, and how many people  are in your household. They will quote your out-of-pocket cost based on that information.  [employer] will also be able to set up a 12-month, interest-free payment plan if needed.  Applying the monitor   Shave hair from upper left chest.  Hold abrader disc by orange tab. Rub abrader in 40 strokes over the upper left chest as  indicated in your monitor instructions.  Clean area with 4 enclosed alcohol pads. Let dry.  Apply patch as indicated in monitor instructions. Patch will be placed under collarbone on left  side of chest with arrow pointing upward.  Rub patch adhesive wings for 2 minutes. Remove white label marked "1". Remove the white  label marked "2". Rub patch adhesive wings for 2 additional minutes.  While looking in a mirror, press and release button in center of patch. A small green light will  flash 3-4 times. This will be your only indicator that the monitor has been turned on.  Do not shower for the first 24 hours. You may shower after the first 24 hours.  Press the button if you feel a symptom. You will hear a small click. Record Date, Time and  Symptom in the Patient Logbook.  When you are ready to remove the patch, follow instructions on the last 2 pages of Patient  Logbook. Stick patch monitor onto the last page of Patient  Logbook.  Place Patient Logbook in the blue and white box. Use locking tab on box and tape box closed  securely. The blue and white box has prepaid postage on it. Please place it in the mailbox as  soon as possible. Your physician should have your test results approximately 7 days after the  monitor has been mailed back to [facility].  Call [place] at [phone] if you have questions regarding  your ZIO XT patch monitor. Call them immediately if you see an orange light  blinking on your  monitor.  If your monitor falls off in less than 4 days, contact our Monitor department at [phone].  If your monitor becomes loose or falls off after 4 days call [employer] at [phone] for  suggestions on securing your monitor    Follow-Up: At [facility], you and your health needs are our priority.  As part of our continuing mission to provide you with exceptional heart care, we have created designated Provider Care Teams.  These Care Teams include your primary Cardiologist (physician) and Advanced Practice Providers (APPs -  Physician Assistants and Nurse Practitioners) who all work together to provide you with the care you need, when you need it.  We recommend signing up for the patient portal called "MyChart".  Sign up information is provided on this After Visit Summary.  MyChart is used to connect with patients for Virtual Visits (Telemedicine).  Patients are able to view lab/test results, encounter notes, upcoming appointments, etc.  Non-urgent messages can be sent to your provider as well.   To learn more about what you can do with MyChart, go to [email or web address].    Your next appointment:     As needed  The format for your next appointment:   In Person  Provider:   As needed      Important Information About Sugar

## 2021-12-28 NOTE — Progress Notes (Unsigned)
Enrolled for [employer] to mail a ZIO XT long term holter monitor to the patients address on file.   Dr. Skains to read. 

## 2022-01-02 DIAGNOSIS — I491 Atrial premature depolarization: Secondary | ICD-10-CM | POA: Diagnosis not present

## 2022-02-10 ENCOUNTER — Other Ambulatory Visit: Payer: Self-pay | Admitting: Physician Assistant

## 2022-02-10 DIAGNOSIS — Z720 Tobacco use: Secondary | ICD-10-CM

## 2022-02-10 DIAGNOSIS — Z0000 Encounter for general adult medical examination without abnormal findings: Secondary | ICD-10-CM

## 2022-03-18 NOTE — H&P (Signed)
Patient's anticipated LOS is less than 2 midnights, meeting these requirements: - Younger than 74 - Lives within 1 hour of care - Has a competent adult at home to recover with post-op recover - NO history of  - Chronic pain requiring opiods  - Diabetes  - Coronary Artery Disease  - Heart failure  - Heart attack  - Stroke  - DVT/VTE  - Cardiac arrhythmia  - Respiratory Failure/COPD  - Renal failure  - Anemia  - Advanced Liver disease     Dylan Berg is an 66 y.o. male.    Chief Complaint: right shoulder pain  HPI: Pt is a 66 y.o. male complaining of right shoulder pain for multiple years. Pain had continually increased since the beginning. X-rays in the clinic show end-stage arthritic changes of the right shoulder. Pt has tried various conservative treatments which have failed to alleviate their symptoms, including injections and therapy. Various options are discussed with the patient. Risks, benefits and expectations were discussed with the patient. Patient understand the risks, benefits and expectations and wishes to proceed with surgery.   PCP:  Verlon Au, MD  D/C Plans: Home  PMH: Past Medical History:  Diagnosis Date   Acid reflux    Enlarged prostate    Hypertension     PSH: Past Surgical History:  Procedure Laterality Date   BACK SURGERY     ROTATOR CUFF REPAIR      Social History:  reports that he has been smoking cigarettes. He has never used smokeless tobacco. He reports current alcohol use. He reports that he does not currently use drugs. BMI: There is no height or weight on file to calculate BMI.  No results found for: "ALBUMIN" Diabetes: Patient does not have a diagnosis of diabetes.     Smoking Status: Social History   Tobacco Use  Smoking Status Every Day   Types: Cigarettes  Smokeless Tobacco Never   Ready to quit: Not Answered Counseling given: Not Answered  The patient has participated in a 4-week cessation program.           Allergies:  No Known Allergies  Medications: No current facility-administered medications for this encounter.   Current Outpatient Medications  Medication Sig Dispense Refill   albuterol (VENTOLIN HFA) 108 (90 Base) MCG/ACT inhaler Inhale 1-2 puffs into the lungs every 6 (six) hours as needed for wheezing or shortness of breath. 18 g 0   amLODipine (NORVASC) 10 MG tablet Take 10 mg by mouth daily.     famotidine (PEPCID) 20 MG tablet Take 20 mg by mouth 2 (two) times daily.     hydrochlorothiazide (HYDRODIURIL) 25 MG tablet Take 25 mg by mouth daily.     LORazepam (ATIVAN) 0.5 MG tablet Take 1 tablet 1 hour before MRI and 1 immediately before scan if needed.  Must have a driver. 2 tablet 0   tamsulosin (FLOMAX) 0.4 MG CAPS capsule Take 1 capsule (0.4 mg total) by mouth daily. 60 capsule 0    No results found for this or any previous visit (from the past 48 hour(s)). No results found.  ROS: Pain with rom of the right upper extremity  Physical Exam: Alert and oriented 66 y.o. male in no acute distress Cranial nerves 2-12 intact Cervical spine: full rom with no tenderness, nv intact distally Chest: active breath sounds bilaterally, no wheeze rhonchi or rales Heart: regular rate and rhythm, no murmur Abd: non tender non distended with active bowel sounds Hip is stable with rom  Right shoulder painful and weak rom  Nv intact distally No rashes or edema distally  Assessment/Plan Assessment: right shoulder cuff arthropathy  Plan:  Patient will undergo a right reverse total shoulder by Dr. Ranell Patrick at [place] Risks benefits and expectations were discussed with the patient. Patient understand risks, benefits and expectations and wishes to proceed. Preoperative templating of the joint replacement has been completed, documented, and submitted to the Operating Room personnel in order to optimize intra-operative equipment management.   Alphonsa Overall PA-C, MPAS [facility] is now [employer] [address], [address], [place], [place] [address] Phone: [phone] [email or web address] Facebook  [employer]

## 2022-03-25 NOTE — Progress Notes (Addendum)
COVID Vaccine Completed:  Yes  Date of COVID positive in last 90 days:  No  PCP - Quita Skye Cardiologist - Donato Schultz, MD  Cardiac clearance in Epic dated 12-27-21 by Robin Searing, NP  Chest x-ray - N/A EKG - 12-28-21 Epic Stress Test - 03-17-15 [facility] ECHO - 03-13-15 [facility] Cardiac Cath - N/A Pacemaker/ICD device last checked: Spinal Cord Stimulator: Long Term Monitor- 2023  Bowel Prep - N/A  Sleep Study - N/A CPAP -   Fasting Blood Sugar - N/A Checks Blood Sugar _____ times a day  Last dose of GLP1 agonist-  N/A GLP1 instructions:  N/A   Last dose of SGLT-2 inhibitors-  N/A SGLT-2 instructions: N/A  Blood Thinner Instructions:  N/A Aspirin Instructions: Last Dose:  Activity level:  Can go up a flight of stairs and perform activities of daily living without stopping and without symptoms of chest pain or shortness of breath. Able to exercise without symptoms  Anesthesia review:  SVT, hx of catheter ablaton 2016,  PACs, CKD, HTN  Patient denies shortness of breath, fever, cough and chest pain at PAT appointment  Patient verbalized understanding of instructions that were given to them at the PAT appointment. Patient was also instructed that they will need to review over the PAT instructions again at home before surgery.

## 2022-03-25 NOTE — Patient Instructions (Addendum)
SURGICAL WAITING ROOM VISITATION Patients having surgery or a procedure may have no more than 2 support people in the waiting area - these visitors may rotate.    If the patient needs to stay at the hospital during part of their recovery, the visitor guidelines for inpatient rooms apply. Pre-op nurse will coordinate an appropriate time for 1 support person to accompany patient in pre-op.  This support person may not rotate.    Please refer to the [facility] website for the visitor guidelines for Inpatients (after your surgery is over and you are in a regular room).   Due to an increase in RSV and influenza rates and associated hospitalizations, children ages 70 and under may not visit patients in [facility] hospitals.     Your procedure is scheduled on: 04-16-22   Report to [facility] Main Entrance    Report to admitting at 12:20 PM   Call this number if you have problems the morning of surgery [phone]   Do not eat food :After Midnight.   After Midnight you may have the following liquids until 11:50 AM DAY OF SURGERY  Water Non-Citrus Juices (without pulp, NO RED) Carbonated Beverages Black Coffee (NO MILK/CREAM OR CREAMERS, sugar ok)  Clear Tea (NO MILK/CREAM OR CREAMERS, sugar ok) regular and decaf                             Plain Jell-O (NO RED)                                           Fruit ices (not with fruit pulp, NO RED)                                     Popsicles (NO RED)                                                               Sports drinks like Gatorade (NO RED)                   The day of surgery:  Drink ONE (1) Pre-Surgery Clear Ensure or G2 at 11:50 AM the morning of surgery. Drink in one sitting. Do not sip.  This drink was given to you during your hospital  pre-op appointment visit. Nothing else to drink after completing the Pre-Surgery Clear Ensure or G2.          If you have questions, please contact your surgeon's  office.   FOLLOW  ANY ADDITIONAL PRE OP INSTRUCTIONS YOU RECEIVED FROM YOUR SURGEON'S OFFICE!!!     Oral Hygiene is also important to reduce your risk of infection.                                    Remember - BRUSH YOUR TEETH THE MORNING OF SURGERY WITH YOUR REGULAR TOOTHPASTE   Do NOT smoke after Midnight   Take these medicines the morning of surgery with A SIP  OF WATER:   Amlodipine  Atorvastatin  Famotidine  Tamsulosin  Tramadol if needed  Okay to use inhalers  DO NOT TAKE ANY ORAL DIABETIC MEDICATIONS DAY OF YOUR SURGERY  Bring CPAP mask and tubing day of surgery.                              You may not have any metal on your body including  jewelry, and body piercing             Do not wear lotions, powders, cologne, or deodorant              Men may shave face and neck.   Do not bring valuables to the hospital. [facility] IS NOT RESPONSIBLE   FOR VALUABLES.   Contacts, dentures or bridgework may not be worn into surgery.  DO NOT BRING YOUR HOME MEDICATIONS TO THE HOSPITAL. PHARMACY WILL DISPENSE MEDICATIONS LISTED ON YOUR MEDICATION LIST TO YOU DURING YOUR ADMISSION IN THE HOSPITAL!    Patients discharged on the day of surgery will not be allowed to drive home.  Someone NEEDS to stay with you for the first 24 hours after anesthesia.   Special Instructions: Bring a copy of your healthcare power of attorney and living will documents the day of surgery if you haven't scanned them before.              Please read over the following fact sheets you were given: IF YOU HAVE QUESTIONS ABOUT YOUR PRE-OP INSTRUCTIONS PLEASE CALL [phone] [facility]- Preparing for Total Shoulder Arthroplasty    Before surgery, you can play an important role. Because skin is not sterile, your skin needs to be as free of germs as possible. You can reduce the number of germs on your skin by using the following products. Benzoyl Peroxide Gel Reduces the number of germs present on  the skin Applied twice a day to shoulder area starting two days before surgery    ==================================================================  Please follow these instructions carefully:  BENZOYL PEROXIDE 5% GEL  Please do not use if you have an allergy to benzoyl peroxide.   If your skin becomes reddened/irritated stop using the benzoyl peroxide.  Starting two days before surgery, apply as follows: Apply benzoyl peroxide in the morning and at night. Apply after taking a shower. If you are not taking a shower clean entire shoulder front, back, and side along with the armpit with a clean wet washcloth.  Place a quarter-sized dollop on your shoulder and rub in thoroughly, making sure to cover the front, back, and side of your shoulder, along with the armpit.   2 days before ____ AM   ____ PM              1 day before ____ AM   ____ PM                         Do this twice a day for two days.  (Last application is the night before surgery, AFTER using the CHG soap as described below).  Do NOT apply benzoyl peroxide gel on the day of surgery.  If you received a COVID test during your pre-op visit  it is requested that you wear a mask when out in public, stay away from anyone that may not be feeling well and notify your surgeon if you develop symptoms. If  you test positive for Covid or have been in contact with anyone that has tested positive in the last 10 days please notify you surgeon.  [facility] - Preparing for Surgery Before surgery, you can play an important role.  Because skin is not sterile, your skin needs to be as free of germs as possible.  You can reduce the number of germs on your skin by washing with CHG (chlorahexidine gluconate) soap before surgery.  CHG is an antiseptic cleaner which kills germs and bonds with the skin to continue killing germs even after washing. Please DO NOT use if you have an allergy to CHG or antibacterial soaps.  If your skin becomes  reddened/irritated stop using the CHG and inform your nurse when you arrive at Short Stay. Do not shave (including legs and underarms) for at least 48 hours prior to the first CHG shower.  You may shave your face/neck.  Please follow these instructions carefully:  1.  Shower with CHG Soap the night before surgery and the  morning of surgery.  2.  If you choose to wash your hair, wash your hair first as usual with your normal  shampoo.  3.  After you shampoo, rinse your hair and body thoroughly to remove the shampoo.                             4.  Use CHG as you would any other liquid soap.  You can apply chg directly to the skin and wash.  Gently with a scrungie or clean washcloth.  5.  Apply the CHG Soap to your body ONLY FROM THE NECK DOWN.   Do   not use on face/ open                           Wound or open sores. Avoid contact with eyes, ears mouth and   genitals (private parts).                       Wash face,  Genitals (private parts) with your normal soap.             6.  Wash thoroughly, paying special attention to the area where your    surgery  will be performed.  7.  Thoroughly rinse your body with warm water from the neck down.  8.  DO NOT shower/wash with your normal soap after using and rinsing off the CHG Soap.                9.  Pat yourself dry with a clean towel.            10.  Wear clean pajamas.            11.  Place clean sheets on your bed the night of your first shower and do not  sleep with pets. Day of Surgery : Do not apply any lotions/deodorants the morning of surgery.  Please wear clean clothes to the hospital/surgery center.  FAILURE TO FOLLOW THESE INSTRUCTIONS MAY RESULT IN THE CANCELLATION OF YOUR SURGERY  PATIENT SIGNATURE_________________________________  NURSE SIGNATURE__________________________________  ________________________________________________________________________    Rogelia MireIncentive Spirometer  An incentive spirometer is a tool that can help  keep your lungs clear and active. This tool measures how well you are filling your lungs with each breath. Taking long deep breaths may help reverse or decrease the chance  of developing breathing (pulmonary) problems (especially infection) following: A long period of time when you are unable to move or be active. BEFORE THE PROCEDURE  If the spirometer includes an indicator to show your best effort, your nurse or respiratory therapist will set it to a desired goal. If possible, sit up straight or lean slightly forward. Try not to slouch. Hold the incentive spirometer in an upright position. INSTRUCTIONS FOR USE  Sit on the edge of your bed if possible, or sit up as far as you can in bed or on a chair. Hold the incentive spirometer in an upright position. Breathe out normally. Place the mouthpiece in your mouth and seal your lips tightly around it. Breathe in slowly and as deeply as possible, raising the piston or the ball toward the top of the column. Hold your breath for 3-5 seconds or for as long as possible. Allow the piston or ball to fall to the bottom of the column. Remove the mouthpiece from your mouth and breathe out normally. Rest for a few seconds and repeat Steps 1 through 7 at least 10 times every 1-2 hours when you are awake. Take your time and take a few normal breaths between deep breaths. The spirometer may include an indicator to show your best effort. Use the indicator as a goal to work toward during each repetition. After each set of 10 deep breaths, practice coughing to be sure your lungs are clear. If you have an incision (the cut made at the time of surgery), support your incision when coughing by placing a pillow or rolled up towels firmly against it. Once you are able to get out of bed, walk around indoors and cough well. You may stop using the incentive spirometer when instructed by your caregiver.  RISKS AND COMPLICATIONS Take your time so you do not get dizzy or  light-headed. If you are in pain, you may need to take or ask for pain medication before doing incentive spirometry. It is harder to take a deep breath if you are having pain. AFTER USE Rest and breathe slowly and easily. It can be helpful to keep track of a log of your progress. Your caregiver can provide you with a simple table to help with this. If you are using the spirometer at home, follow these instructions: SEEK MEDICAL CARE IF:  You are having difficultly using the spirometer. You have trouble using the spirometer as often as instructed. Your pain medication is not giving enough relief while using the spirometer. You develop fever of 100.5 F (38.1 C) or higher. SEEK IMMEDIATE MEDICAL CARE IF:  You cough up bloody sputum that had not been present before. You develop fever of 102 F (38.9 C) or greater. You develop worsening pain at or near the incision site. MAKE SURE YOU:  Understand these instructions. Will watch your condition. Will get help right away if you are not doing well or get worse. Document Released: 08/02/2006 Document Revised: 06/14/2011 Document Reviewed: 10/03/2006 [facility] Patient Information 2014 [place], [place].   ________________________________________________________________________

## 2022-04-06 ENCOUNTER — Encounter (HOSPITAL_COMMUNITY)
Admission: RE | Admit: 2022-04-06 | Discharge: 2022-04-06 | Disposition: A | Discharge status: Home / Self Care | Payer: Medicare Other | Source: Ambulatory Visit | Attending: Orthopedic Surgery | Admitting: Orthopedic Surgery

## 2022-04-06 ENCOUNTER — Encounter (HOSPITAL_COMMUNITY): Payer: Self-pay

## 2022-04-06 ENCOUNTER — Other Ambulatory Visit: Payer: Self-pay

## 2022-04-06 VITALS — BP 143/80 | HR 85 | Temp 98.1°F | Resp 16 | Ht 71.0 in | Wt 210.2 lb

## 2022-04-06 DIAGNOSIS — Z01818 Encounter for other preprocedural examination: Secondary | ICD-10-CM

## 2022-04-06 DIAGNOSIS — I2510 Atherosclerotic heart disease of native coronary artery without angina pectoris: Secondary | ICD-10-CM | POA: Insufficient documentation

## 2022-04-06 DIAGNOSIS — Z01812 Encounter for preprocedural laboratory examination: Secondary | ICD-10-CM | POA: Insufficient documentation

## 2022-04-06 HISTORY — DX: Chronic kidney disease, unspecified: N18.9

## 2022-04-06 HISTORY — DX: Pneumonia, unspecified organism: J18.9

## 2022-04-06 HISTORY — DX: Supraventricular tachycardia, unspecified: I47.10

## 2022-04-06 LAB — CBC
HCT: 38.4 % — ABNORMAL LOW (ref 39.0–52.0)
Hemoglobin: 12.7 g/dL — ABNORMAL LOW (ref 13.0–17.0)
MCH: 28.8 pg (ref 26.0–34.0)
MCHC: 33.1 g/dL (ref 30.0–36.0)
MCV: 87.1 fL (ref 80.0–100.0)
Platelets: 301 10*3/uL (ref 150–400)
RBC: 4.41 MIL/uL (ref 4.22–5.81)
RDW: 14.1 % (ref 11.5–15.5)
WBC: 7.4 10*3/uL (ref 4.0–10.5)
nRBC: 0 % (ref 0.0–0.2)

## 2022-04-06 LAB — BASIC METABOLIC PANEL
Anion gap: 9 (ref 5–15)
BUN: 33 mg/dL — ABNORMAL HIGH (ref 8–23)
CO2: 19 mmol/L — ABNORMAL LOW (ref 22–32)
Calcium: 8.8 mg/dL — ABNORMAL LOW (ref 8.9–10.3)
Chloride: 110 mmol/L (ref 98–111)
Creatinine, Ser: 1.49 mg/dL — ABNORMAL HIGH (ref 0.61–1.24)
GFR, Estimated: 51 mL/min — ABNORMAL LOW (ref >60)
Glucose, Bld: 95 mg/dL (ref 70–99)
Potassium: 4.5 mmol/L (ref 3.5–5.1)
Sodium: 138 mmol/L (ref 135–145)

## 2022-04-06 LAB — SURGICAL PCR SCREEN
MRSA, PCR: NEGATIVE
Staphylococcus aureus: NEGATIVE

## 2022-04-11 NOTE — Anesthesia Preprocedure Evaluation (Addendum)
Anesthesia Evaluation  Patient identified by MRN, date of birth, ID band Patient awake    Reviewed: Allergy & Precautions, NPO status , Patient's Chart, lab work & pertinent test results  History of Anesthesia Complications Negative for: history of anesthetic complications  Airway Mallampati: III  TM Distance: >3 FB Neck ROM: Full    Dental  (+) Dental Advisory Given   Pulmonary neg shortness of breath, neg sleep apnea, neg COPD, neg recent URI, former smoker   breath sounds clear to auscultation       Cardiovascular hypertension, Pt. on medications (-) angina (-) Past MI  Rhythm:Regular     Neuro/Psych negative neurological ROS  negative psych ROS   GI/Hepatic Neg liver ROS,GERD  ,,  Endo/Other  negative endocrine ROS    Renal/GU CRFRenal disease     Musculoskeletal   Abdominal   Peds  Hematology negative hematology ROS (+)   Anesthesia Other Findings   Reproductive/Obstetrics                             Anesthesia Physical Anesthesia Plan  ASA: 2  Anesthesia Plan: General and Regional   Post-op Pain Management: Regional block*   Induction: Intravenous  PONV Risk Score and Plan: 2 and Ondansetron and Dexamethasone  Airway Management Planned: Oral ETT  Additional Equipment: None  Intra-op Plan:   Post-operative Plan: Extubation in OR  Informed Consent: I have reviewed the patients History and Physical, chart, labs and discussed the procedure including the risks, benefits and alternatives for the proposed anesthesia with the patient or authorized representative who has indicated his/her understanding and acceptance.     Dental advisory given  Plan Discussed with: CRNA  Anesthesia Plan Comments: (See PAT note 04/06/2022)       Anesthesia Quick Evaluation

## 2022-04-11 NOTE — Progress Notes (Signed)
Anesthesia Chart Review   Case: [id number] Date/Time: 04/16/22 1436   Procedure: REVERSE SHOULDER ARTHROPLASTY (Right: Shoulder) - 120 min choice and general   Anesthesia type: Choice   Pre-op diagnosis: right shoulder rotator cuff tear   Location: [facility] ROOM 06 / [facility]   Surgeons: Netta Cedars, MD       DISCUSSION:67 y.o. former smoker with h/o HTN, CKD Stage III followed by nephrology, SVT, right shoulder rotator cuff tear scheduled for above procedure 04/16/22 with Dr. Netta Cedars.  H/o back surgery. Per notes, "SPINE SURGERY 04/22/2015  Minimally invasive removal of existing L4-5 pedicle screw fusion hardware/Minimally invasive L4-5 fusion exploration/Minimally invasive left L5-S1 diskectomy with nerve root decompression/Left L5-S1 minimally invasive transforaminal lumbar interbody fusion (K2M)/Minimally invasive placement of bilateral titanium non-cannulated Everest screws at S1 with fusion--Dr. Floy Berg"  Pt seen by cardiology 12/28/2021 for preoperative evaluation.  Per OV note, "The patient affirms he has been doing well without any new cardiac symptoms. They are able to achieve 6 METS without cardiac limitations. Therefore, based on ACC/AHA guidelines, the patient would be at acceptable risk for the planned procedure without further cardiovascular testing. The patient was advised that if he develops new symptoms prior to surgery to contact our office to arrange for a follow-up visit, and he verbalized understanding.    Dylan Berg perioperative risk of a major cardiac event is 0.4% according to the Revised Cardiac Risk Index (RCRI).  Therefore, he is at low risk for perioperative complications.   His functional capacity is good at 6.91 METs according to the Duke Activity Status Index (DASI). Recommendations: According to ACC/AHA guidelines, no further cardiovascular testing needed.  The patient may proceed to surgery at acceptable risk."  Labs reviewed in Care Everywhere, creatinine  appears to be baseline.  He is followed by nephrology.   Anticipate pt can proceed with planned procedure barring acute status change.   VS: BP (!) 143/80   Pulse 85   Temp 36.7 C (Oral)   Resp 16   Ht 5\' 11"  (1.803 m)   Wt 95.3 kg   SpO2 99%   BMI 29.32 kg/m   PROVIDERS: Dylan Flirt, MD is PCP    LABS: Labs reviewed: Acceptable for surgery. (all labs ordered are listed, but only abnormal results are displayed)  Labs Reviewed  BASIC METABOLIC PANEL - Abnormal; Notable for the following components:      Result Value   CO2 19 (*)    BUN 33 (*)    Creatinine, Ser 1.49 (*)    Calcium 8.8 (*)    GFR, Estimated 51 (*)    All other components within normal limits  CBC - Abnormal; Notable for the following components:   Hemoglobin 12.7 (*)    HCT 38.4 (*)    All other components within normal limits  SURGICAL PCR SCREEN     IMAGES:   EKG:   CV:  Past Medical History:  Diagnosis Date   Acid reflux    Chronic kidney disease    Enlarged prostate    Hypertension    Pneumonia    as a child   SVT (supraventricular tachycardia)     Past Surgical History:  Procedure Laterality Date   BACK SURGERY     CARDIAC ELECTROPHYSIOLOGY STUDY AND ABLATION     COLONOSCOPY     PROSTATE BIOPSY     ROTATOR CUFF REPAIR      MEDICATIONS:  albuterol (VENTOLIN HFA) 108 (90 Base) MCG/ACT inhaler   amLODipine (  NORVASC) 10 MG tablet   atorvastatin (LIPITOR) 10 MG tablet   famotidine (PEPCID) 20 MG tablet   FLOVENT HFA 110 MCG/ACT inhaler   hydrochlorothiazide (HYDRODIURIL) 25 MG tablet   LORazepam (ATIVAN) 0.5 MG tablet   tamsulosin (FLOMAX) 0.4 MG CAPS capsule   traMADol (ULTRAM) 50 MG tablet   No current facility-administered medications for this encounter.      Dylan Dylan Ward, PA-C [facility] Pre-Surgical Testing [phone]

## 2022-04-16 ENCOUNTER — Encounter (HOSPITAL_COMMUNITY)
Admission: RE | Disposition: A | Discharge status: Home / Self Care | Payer: Self-pay | Source: Home / Self Care | Attending: Orthopedic Surgery

## 2022-04-16 ENCOUNTER — Ambulatory Visit (HOSPITAL_BASED_OUTPATIENT_CLINIC_OR_DEPARTMENT_OTHER): Payer: Worker's Compensation | Admitting: Anesthesiology

## 2022-04-16 ENCOUNTER — Other Ambulatory Visit: Payer: Self-pay

## 2022-04-16 ENCOUNTER — Observation Stay (HOSPITAL_COMMUNITY)
Admission: RE | Admit: 2022-04-16 | Discharge: 2022-04-17 | Disposition: A | Discharge status: Home / Self Care | Payer: Worker's Compensation | Attending: Orthopedic Surgery | Admitting: Orthopedic Surgery

## 2022-04-16 ENCOUNTER — Observation Stay (HOSPITAL_COMMUNITY): Payer: Medicare Other

## 2022-04-16 ENCOUNTER — Encounter (HOSPITAL_COMMUNITY): Payer: Self-pay | Admitting: Orthopedic Surgery

## 2022-04-16 ENCOUNTER — Ambulatory Visit (HOSPITAL_COMMUNITY): Payer: Worker's Compensation | Admitting: Physician Assistant

## 2022-04-16 DIAGNOSIS — M75101 Unspecified rotator cuff tear or rupture of right shoulder, not specified as traumatic: Principal | ICD-10-CM | POA: Insufficient documentation

## 2022-04-16 DIAGNOSIS — F17210 Nicotine dependence, cigarettes, uncomplicated: Secondary | ICD-10-CM | POA: Diagnosis not present

## 2022-04-16 DIAGNOSIS — M12811 Other specific arthropathies, not elsewhere classified, right shoulder: Secondary | ICD-10-CM | POA: Diagnosis not present

## 2022-04-16 DIAGNOSIS — Z96611 Presence of right artificial shoulder joint: Secondary | ICD-10-CM

## 2022-04-16 DIAGNOSIS — N189 Chronic kidney disease, unspecified: Secondary | ICD-10-CM | POA: Insufficient documentation

## 2022-04-16 DIAGNOSIS — I129 Hypertensive chronic kidney disease with stage 1 through stage 4 chronic kidney disease, or unspecified chronic kidney disease: Secondary | ICD-10-CM | POA: Insufficient documentation

## 2022-04-16 DIAGNOSIS — Z79899 Other long term (current) drug therapy: Secondary | ICD-10-CM | POA: Diagnosis not present

## 2022-04-16 HISTORY — PX: REVERSE SHOULDER ARTHROPLASTY: SHX5054

## 2022-04-16 SURGERY — ARTHROPLASTY, SHOULDER, TOTAL, REVERSE
Anesthesia: Choice | Site: Shoulder | Laterality: Right

## 2022-04-16 MED ORDER — METOCLOPRAMIDE HCL 5 MG/ML IJ SOLN: 5.0000 mg | Freq: Three times a day (TID) | INTRAMUSCULAR | Status: DC | PRN

## 2022-04-16 MED ORDER — PHENYLEPHRINE HCL (PRESSORS) 10 MG/ML IV SOLN
INTRAVENOUS | Status: AC
  Filled 2022-04-16: qty 1

## 2022-04-16 MED ORDER — AMLODIPINE BESYLATE 10 MG PO TABS
10.0000 mg | ORAL_TABLET | Freq: Every day | ORAL | Status: DC
  Administered 2022-04-17: 10 mg via ORAL
  Filled 2022-04-16: qty 1

## 2022-04-16 MED ORDER — ACETAMINOPHEN 500 MG PO TABS: 1000.0000 mg | ORAL_TABLET | Freq: Once | ORAL | Status: DC | PRN

## 2022-04-16 MED ORDER — MENTHOL 3 MG MT LOZG: 1.0000 | LOZENGE | OROMUCOSAL | Status: DC | PRN

## 2022-04-16 MED ORDER — ONDANSETRON HCL 4 MG PO TABS: 4.0000 mg | ORAL_TABLET | Freq: Four times a day (QID) | ORAL | Status: DC | PRN

## 2022-04-16 MED ORDER — ROCURONIUM BROMIDE 100 MG/10ML IV SOLN
INTRAVENOUS | Status: DC | PRN
  Administered 2022-04-16: 60 mg via INTRAVENOUS

## 2022-04-16 MED ORDER — ATORVASTATIN CALCIUM 10 MG PO TABS
10.0000 mg | ORAL_TABLET | Freq: Every day | ORAL | Status: DC
  Administered 2022-04-17: 10 mg via ORAL
  Filled 2022-04-16: qty 1

## 2022-04-16 MED ORDER — HYDROCHLOROTHIAZIDE 25 MG PO TABS
25.0000 mg | ORAL_TABLET | Freq: Every day | ORAL | Status: DC
  Administered 2022-04-17: 25 mg via ORAL
  Filled 2022-04-16: qty 1

## 2022-04-16 MED ORDER — SODIUM CHLORIDE 0.9 % IV SOLN: INTRAVENOUS | Status: DC

## 2022-04-16 MED ORDER — ACETAMINOPHEN 10 MG/ML IV SOLN
INTRAVENOUS | Status: AC
  Filled 2022-04-16: qty 100

## 2022-04-16 MED ORDER — TRANEXAMIC ACID-NACL 1000-0.7 MG/100ML-% IV SOLN: 1000.0000 mg | INTRAVENOUS | Status: DC

## 2022-04-16 MED ORDER — BUPIVACAINE-EPINEPHRINE (PF) 0.5% -1:200000 IJ SOLN
INTRAMUSCULAR | Status: DC | PRN
  Administered 2022-04-16: 13 mL via PERINEURAL

## 2022-04-16 MED ORDER — ACETAMINOPHEN 325 MG PO TABS
325.0000 mg | ORAL_TABLET | Freq: Four times a day (QID) | ORAL | Status: DC | PRN
  Administered 2022-04-17: 650 mg via ORAL
  Filled 2022-04-16: qty 2

## 2022-04-16 MED ORDER — DEXMEDETOMIDINE HCL IN NACL 80 MCG/20ML IV SOLN
INTRAVENOUS | Status: AC
  Filled 2022-04-16: qty 20

## 2022-04-16 MED ORDER — METHOCARBAMOL 500 MG PO TABS: 500.0000 mg | ORAL_TABLET | Freq: Three times a day (TID) | ORAL | 1 refills | Status: AC | PRN

## 2022-04-16 MED ORDER — FENTANYL CITRATE (PF) 100 MCG/2ML IJ SOLN
INTRAMUSCULAR | Status: AC
  Filled 2022-04-16: qty 2

## 2022-04-16 MED ORDER — FAMOTIDINE 20 MG PO TABS
20.0000 mg | ORAL_TABLET | Freq: Two times a day (BID) | ORAL | Status: DC
  Administered 2022-04-16 – 2022-04-17 (×2): 20 mg via ORAL
  Filled 2022-04-16 (×2): qty 1

## 2022-04-16 MED ORDER — DOCUSATE SODIUM 100 MG PO CAPS
100.0000 mg | ORAL_CAPSULE | Freq: Two times a day (BID) | ORAL | Status: DC
  Administered 2022-04-16 – 2022-04-17 (×2): 100 mg via ORAL
  Filled 2022-04-16 (×2): qty 1

## 2022-04-16 MED ORDER — OXYCODONE-ACETAMINOPHEN 5-325 MG PO TABS: 1.0000 | ORAL_TABLET | ORAL | 0 refills | Status: DC | PRN

## 2022-04-16 MED ORDER — BUDESONIDE 0.25 MG/2ML IN SUSP
0.2500 mg | Freq: Two times a day (BID) | RESPIRATORY_TRACT | Status: DC
  Administered 2022-04-16 – 2022-04-17 (×2): 0.25 mg via RESPIRATORY_TRACT
  Filled 2022-04-16 (×2): qty 2

## 2022-04-16 MED ORDER — SODIUM CHLORIDE 0.9 % IR SOLN
Status: DC | PRN
  Administered 2022-04-16: 1000 mL

## 2022-04-16 MED ORDER — HYDROMORPHONE HCL 1 MG/ML IJ SOLN: 0.5000 mg | INTRAMUSCULAR | Status: DC | PRN

## 2022-04-16 MED ORDER — PROPOFOL 10 MG/ML IV BOLUS
INTRAVENOUS | Status: DC | PRN
  Administered 2022-04-16: 160 mg via INTRAVENOUS

## 2022-04-16 MED ORDER — ACETAMINOPHEN 10 MG/ML IV SOLN: 1000.0000 mg | Freq: Once | INTRAVENOUS | Status: DC | PRN

## 2022-04-16 MED ORDER — FENTANYL CITRATE PF 50 MCG/ML IJ SOSY
100.0000 ug | PREFILLED_SYRINGE | INTRAMUSCULAR | Status: DC
  Administered 2022-04-16: 50 ug via INTRAVENOUS
  Filled 2022-04-16: qty 2

## 2022-04-16 MED ORDER — OXYCODONE HCL 5 MG PO TABS
5.0000 mg | ORAL_TABLET | Freq: Once | ORAL | Status: AC | PRN
  Administered 2022-04-16: 5 mg via ORAL

## 2022-04-16 MED ORDER — TAMSULOSIN HCL 0.4 MG PO CAPS
0.4000 mg | ORAL_CAPSULE | Freq: Every day | ORAL | Status: DC
  Administered 2022-04-17: 0.4 mg via ORAL
  Filled 2022-04-16: qty 1

## 2022-04-16 MED ORDER — ONDANSETRON HCL 4 MG/2ML IJ SOLN: 4.0000 mg | Freq: Four times a day (QID) | INTRAMUSCULAR | Status: DC | PRN

## 2022-04-16 MED ORDER — TRANEXAMIC ACID-NACL 1000-0.7 MG/100ML-% IV SOLN
INTRAVENOUS | Status: AC
  Filled 2022-04-16: qty 100

## 2022-04-16 MED ORDER — TRAMADOL HCL 50 MG PO TABS
50.0000 mg | ORAL_TABLET | Freq: Four times a day (QID) | ORAL | Status: DC | PRN
  Administered 2022-04-16 – 2022-04-17 (×2): 50 mg via ORAL
  Filled 2022-04-16 (×2): qty 1

## 2022-04-16 MED ORDER — ONDANSETRON HCL 4 MG/2ML IJ SOLN
INTRAMUSCULAR | Status: DC | PRN
  Administered 2022-04-16: 4 mg via INTRAVENOUS

## 2022-04-16 MED ORDER — BUPIVACAINE LIPOSOME 1.3 % IJ SUSP
INTRAMUSCULAR | Status: DC | PRN
  Administered 2022-04-16: 10 mL via PERINEURAL

## 2022-04-16 MED ORDER — METHOCARBAMOL 1000 MG/10ML IJ SOLN: 500.0000 mg | Freq: Four times a day (QID) | INTRAVENOUS | Status: DC | PRN

## 2022-04-16 MED ORDER — DEXMEDETOMIDINE HCL IN NACL 80 MCG/20ML IV SOLN
INTRAVENOUS | Status: DC | PRN
  Administered 2022-04-16: 20 ug via BUCCAL

## 2022-04-16 MED ORDER — FENTANYL CITRATE (PF) 100 MCG/2ML IJ SOLN
INTRAMUSCULAR | Status: DC | PRN
  Administered 2022-04-16: 100 ug via INTRAVENOUS

## 2022-04-16 MED ORDER — DEXAMETHASONE SODIUM PHOSPHATE 10 MG/ML IJ SOLN
INTRAMUSCULAR | Status: DC | PRN
  Administered 2022-04-16: 10 mg via INTRAVENOUS

## 2022-04-16 MED ORDER — ACETAMINOPHEN 10 MG/ML IV SOLN
INTRAVENOUS | Status: DC | PRN
  Administered 2022-04-16: 1000 mg via INTRAVENOUS

## 2022-04-16 MED ORDER — CEFAZOLIN SODIUM-DEXTROSE 2-4 GM/100ML-% IV SOLN
2.0000 g | INTRAVENOUS | Status: DC
  Filled 2022-04-16: qty 100

## 2022-04-16 MED ORDER — OXYCODONE HCL 5 MG/5ML PO SOLN: 5.0000 mg | Freq: Once | ORAL | Status: AC | PRN

## 2022-04-16 MED ORDER — PHENOL 1.4 % MT LIQD: 1.0000 | OROMUCOSAL | Status: DC | PRN

## 2022-04-16 MED ORDER — METHOCARBAMOL 500 MG PO TABS
500.0000 mg | ORAL_TABLET | Freq: Four times a day (QID) | ORAL | Status: DC | PRN
  Administered 2022-04-16 – 2022-04-17 (×2): 500 mg via ORAL
  Filled 2022-04-16 (×2): qty 1

## 2022-04-16 MED ORDER — LACTATED RINGERS IV SOLN: INTRAVENOUS | Status: DC

## 2022-04-16 MED ORDER — MIDAZOLAM HCL 2 MG/2ML IJ SOLN
2.0000 mg | INTRAMUSCULAR | Status: DC
  Administered 2022-04-16: 2 mg via INTRAVENOUS
  Administered 2022-04-16: 1 mg via INTRAVENOUS
  Filled 2022-04-16: qty 2

## 2022-04-16 MED ORDER — PROPOFOL 10 MG/ML IV BOLUS
INTRAVENOUS | Status: AC
  Filled 2022-04-16: qty 20

## 2022-04-16 MED ORDER — BUPIVACAINE-EPINEPHRINE (PF) 0.5% -1:200000 IJ SOLN
INTRAMUSCULAR | Status: DC | PRN
  Administered 2022-04-16: 20 mL via PERINEURAL

## 2022-04-16 MED ORDER — ORAL CARE MOUTH RINSE: 15.0000 mL | Freq: Once | OROMUCOSAL | Status: AC

## 2022-04-16 MED ORDER — TRANEXAMIC ACID-NACL 1000-0.7 MG/100ML-% IV SOLN
INTRAVENOUS | Status: DC | PRN
  Administered 2022-04-16: 1000 mg via INTRAVENOUS

## 2022-04-16 MED ORDER — ALBUTEROL SULFATE (2.5 MG/3ML) 0.083% IN NEBU
3.0000 mL | INHALATION_SOLUTION | Freq: Four times a day (QID) | RESPIRATORY_TRACT | Status: DC | PRN
  Administered 2022-04-16: 3 mL via RESPIRATORY_TRACT
  Filled 2022-04-16: qty 3

## 2022-04-16 MED ORDER — PHENYLEPHRINE HCL-NACL 20-0.9 MG/250ML-% IV SOLN
INTRAVENOUS | Status: DC | PRN
  Administered 2022-04-16: 50 ug/min via INTRAVENOUS

## 2022-04-16 MED ORDER — MIDAZOLAM HCL 2 MG/2ML IJ SOLN
INTRAMUSCULAR | Status: AC
  Filled 2022-04-16: qty 2

## 2022-04-16 MED ORDER — POLYETHYLENE GLYCOL 3350 17 G PO PACK: 17.0000 g | PACK | Freq: Every day | ORAL | Status: DC | PRN

## 2022-04-16 MED ORDER — CEFAZOLIN SODIUM-DEXTROSE 2-3 GM-%(50ML) IV SOLR
INTRAVENOUS | Status: DC | PRN
  Administered 2022-04-16: 2 g via INTRAVENOUS

## 2022-04-16 MED ORDER — OXYCODONE HCL 5 MG PO TABS
ORAL_TABLET | ORAL | Status: AC
  Filled 2022-04-16: qty 1

## 2022-04-16 MED ORDER — LIDOCAINE HCL (CARDIAC) PF 100 MG/5ML IV SOSY
PREFILLED_SYRINGE | INTRAVENOUS | Status: DC | PRN
  Administered 2022-04-16: 100 mg via INTRAVENOUS

## 2022-04-16 MED ORDER — FENTANYL CITRATE PF 50 MCG/ML IJ SOSY
25.0000 ug | PREFILLED_SYRINGE | INTRAMUSCULAR | Status: DC | PRN
  Administered 2022-04-16: 50 ug via INTRAVENOUS

## 2022-04-16 MED ORDER — ACETAMINOPHEN 160 MG/5ML PO SOLN: 1000.0000 mg | Freq: Once | ORAL | Status: DC | PRN

## 2022-04-16 MED ORDER — BISACODYL 10 MG RE SUPP: 10.0000 mg | Freq: Every day | RECTAL | Status: DC | PRN

## 2022-04-16 MED ORDER — CEFAZOLIN SODIUM-DEXTROSE 2-4 GM/100ML-% IV SOLN
2.0000 g | Freq: Four times a day (QID) | INTRAVENOUS | Status: AC
  Administered 2022-04-16 – 2022-04-17 (×3): 2 g via INTRAVENOUS
  Filled 2022-04-16 (×3): qty 100

## 2022-04-16 MED ORDER — PHENYLEPHRINE HCL (PRESSORS) 10 MG/ML IV SOLN
INTRAVENOUS | Status: DC | PRN
  Administered 2022-04-16 (×5): 160 ug via INTRAVENOUS

## 2022-04-16 MED ORDER — METOCLOPRAMIDE HCL 5 MG PO TABS: 5.0000 mg | ORAL_TABLET | Freq: Three times a day (TID) | ORAL | Status: DC | PRN

## 2022-04-16 MED ORDER — CHLORHEXIDINE GLUCONATE 0.12 % MT SOLN
15.0000 mL | Freq: Once | OROMUCOSAL | Status: AC
  Administered 2022-04-16: 15 mL via OROMUCOSAL

## 2022-04-16 MED ORDER — ONDANSETRON HCL 4 MG PO TABS: 4.0000 mg | ORAL_TABLET | Freq: Three times a day (TID) | ORAL | 1 refills | Status: DC | PRN

## 2022-04-16 MED ORDER — FENTANYL CITRATE PF 50 MCG/ML IJ SOSY
PREFILLED_SYRINGE | INTRAMUSCULAR | Status: AC
  Filled 2022-04-16: qty 1

## 2022-04-16 MED ORDER — OXYCODONE HCL 5 MG PO TABS
5.0000 mg | ORAL_TABLET | ORAL | Status: DC | PRN
  Administered 2022-04-16 – 2022-04-17 (×3): 10 mg via ORAL
  Filled 2022-04-16 (×3): qty 2

## 2022-04-16 SURGICAL SUPPLY — 74 items
BAG COUNTER SPONGE SURGICOUNT (BAG) IMPLANT
BAG ZIPLOCK 12X15 (MISCELLANEOUS) IMPLANT
BIT DRILL 1.6MX128 (BIT) IMPLANT
BIT DRILL 170X2.5X (BIT) IMPLANT
BIT DRL 170X2.5X (BIT) ×1
BLADE SAG 18X100X1.27 (BLADE) ×1 IMPLANT
COVER BACK TABLE 60X90IN (DRAPES) ×1 IMPLANT
COVER SURGICAL LIGHT HANDLE (MISCELLANEOUS) ×1 IMPLANT
CUP HUMERAL 42 PLUS 3 (Orthopedic Implant) IMPLANT
DRAPE INCISE IOBAN 66X45 STRL (DRAPES) ×1 IMPLANT
DRAPE ORTHO SPLIT 77X108 STRL (DRAPES) ×2
DRAPE SHEET LG 3/4 BI-LAMINATE (DRAPES) ×1 IMPLANT
DRAPE SURG ORHT 6 SPLT 77X108 (DRAPES) ×2 IMPLANT
DRAPE TOP 10253 STERILE (DRAPES) ×1 IMPLANT
DRAPE U-SHAPE 47X51 STRL (DRAPES) ×1 IMPLANT
DRILL 2.5 (BIT) ×1
DRSG ADAPTIC 3X8 NADH LF (GAUZE/BANDAGES/DRESSINGS) ×1 IMPLANT
DURAPREP 26ML APPLICATOR (WOUND CARE) ×1 IMPLANT
ELECT BLADE TIP CTD 4 INCH (ELECTRODE) ×1 IMPLANT
ELECT NDL TIP 2.8 STRL (NEEDLE) ×1 IMPLANT
ELECT NEEDLE TIP 2.8 STRL (NEEDLE) ×1 IMPLANT
ELECT REM PT RETURN 15FT ADLT (MISCELLANEOUS) ×1 IMPLANT
EPIPHYSI RIGHT SZ 2 (Shoulder) ×1 IMPLANT
EPIPHYSIS RIGHT SZ 2 (Shoulder) IMPLANT
FACESHIELD WRAPAROUND (MASK) ×1 IMPLANT
FACESHIELD WRAPAROUND OR TEAM (MASK) ×1 IMPLANT
GAUZE PAD ABD 7.5X8 STRL (GAUZE/BANDAGES/DRESSINGS) IMPLANT
GAUZE PAD ABD 8X10 STRL (GAUZE/BANDAGES/DRESSINGS) ×1 IMPLANT
GAUZE SPONGE 4X4 12PLY STRL (GAUZE/BANDAGES/DRESSINGS) ×1 IMPLANT
GLENOSPHERE XTEND LAT 42+0 STD (Miscellaneous) IMPLANT
GLOVE BIOGEL PI IND STRL 7.5 (GLOVE) ×1 IMPLANT
GLOVE BIOGEL PI IND STRL 8.5 (GLOVE) ×1 IMPLANT
GLOVE ORTHO TXT STRL SZ7.5 (GLOVE) ×1 IMPLANT
GLOVE SURG ORTHO 8.5 STRL (GLOVE) ×1 IMPLANT
GOWN STRL REUS W/ TWL XL LVL3 (GOWN DISPOSABLE) ×2 IMPLANT
GOWN STRL REUS W/TWL XL LVL3 (GOWN DISPOSABLE) ×2
KIT BASIN OR (CUSTOM PROCEDURE TRAY) ×1 IMPLANT
KIT TURNOVER KIT A (KITS) IMPLANT
MANIFOLD NEPTUNE II (INSTRUMENTS) ×1 IMPLANT
METAGLENE DELTA EXTEND (Trauma) IMPLANT
METAGLENE DXTEND (Trauma) ×1 IMPLANT
NDL MAYO 6 CRC TAPER PT (NEEDLE) IMPLANT
NDL MAYO CATGUT SZ4 TPR NDL (NEEDLE) ×1 IMPLANT
NEEDLE MAYO 6 CRC TAPER PT (NEEDLE) ×1 IMPLANT
NEEDLE MAYO CATGUT SZ4 (NEEDLE) ×1 IMPLANT
NS IRRIG 1000ML POUR BTL (IV SOLUTION) ×1 IMPLANT
PACK SHOULDER (CUSTOM PROCEDURE TRAY) ×1 IMPLANT
PIN GUIDE 1.2 (PIN) IMPLANT
PIN GUIDE GLENOPHERE 1.5MX300M (PIN) IMPLANT
PIN METAGLENE 2.5 (PIN) IMPLANT
PROTECTOR NERVE ULNAR (MISCELLANEOUS) ×1 IMPLANT
RESTRAINT HEAD UNIVERSAL NS (MISCELLANEOUS) ×1 IMPLANT
SCREW 4.5X24MM (Screw) ×1 IMPLANT
SCREW 4.5X36MM (Screw) IMPLANT
SCREW BN 24X4.5XLCK STRL (Screw) IMPLANT
SCREW LOCK 42 (Screw) IMPLANT
SLING ARM FOAM STRAP LRG (SOFTGOODS) IMPLANT
SMARTMIX MINI TOWER (MISCELLANEOUS)
SPIKE FLUID TRANSFER (MISCELLANEOUS) ×1 IMPLANT
SPONGE T-LAP 4X18 ~~LOC~~+RFID (SPONGE) ×1 IMPLANT
STEM 12 HA (Stem) IMPLANT
STRIP CLOSURE SKIN 1/2X4 (GAUZE/BANDAGES/DRESSINGS) ×1 IMPLANT
SUCTION FRAZIER HANDLE 10FR (MISCELLANEOUS) ×1
SUCTION TUBE FRAZIER 10FR DISP (MISCELLANEOUS) ×1 IMPLANT
SUT FIBERWIRE #2 38 T-5 BLUE (SUTURE) ×4
SUT MNCRL AB 4-0 PS2 18 (SUTURE) ×1 IMPLANT
SUT VIC AB 0 CT1 36 (SUTURE) ×2 IMPLANT
SUT VIC AB 0 CT2 27 (SUTURE) ×1 IMPLANT
SUT VIC AB 2-0 CT1 27 (SUTURE) ×1
SUT VIC AB 2-0 CT1 TAPERPNT 27 (SUTURE) ×1 IMPLANT
SUTURE FIBERWR #2 38 T-5 BLUE (SUTURE) ×2 IMPLANT
TAPE PAPER 3X10 WHT MICROPORE (GAUZE/BANDAGES/DRESSINGS) IMPLANT
TOWEL OR 17X26 10 PK STRL BLUE (TOWEL DISPOSABLE) ×1 IMPLANT
TOWER SMARTMIX MINI (MISCELLANEOUS) IMPLANT

## 2022-04-16 NOTE — Anesthesia Procedure Notes (Signed)
Anesthesia Regional Block: Interscalene brachial plexus block   Pre-Anesthetic Checklist: , timeout performed,  Correct Patient, Correct Site, Correct Laterality,  Correct Procedure, Correct Position, site marked,  Risks and benefits discussed,  Surgical consent,  Pre-op evaluation,  At surgeon's request and post-op pain management  Laterality: Right and Upper  Prep: chloraprep       Needles:  Injection technique: Single-shot      Needle Length: 5cm  Needle Gauge: 22     Additional Needles: Arrow StimuQuik ECHO Echogenic Stimulating PNB Needle  Procedures:,,,, ultrasound used (permanent image in chart),,    Narrative:  Start time: 04/16/2022 11:44 AM End time: 04/16/2022 11:51 AM Injection made incrementally with aspirations every 5 mL.  Performed by: Personally  Anesthesiologist: Oleta Mouse, MD

## 2022-04-16 NOTE — Anesthesia Procedure Notes (Signed)
Procedure Name: Intubation Date/Time: 04/16/2022 12:44 PM  Performed by: Jonna Munro, CRNAPre-anesthesia Checklist: Patient identified, Emergency Drugs available, Suction available, Patient being monitored and Timeout performed Patient Re-evaluated:Patient Re-evaluated prior to induction Oxygen Delivery Method: Circle system utilized Preoxygenation: Pre-oxygenation with 100% oxygen Induction Type: IV induction Ventilation: Mask ventilation without difficulty Laryngoscope Size: Mac and 3 Grade View: Grade I Tube type: Oral Number of attempts: 1 Airway Equipment and Method: Stylet Placement Confirmation: ETT inserted through vocal cords under direct vision, positive ETCO2, CO2 detector and breath sounds checked- equal and bilateral Secured at: 23 cm Tube secured with: Tape Dental Injury: Teeth and Oropharynx as per pre-operative assessment

## 2022-04-16 NOTE — Op Note (Signed)
Dylan Berg, Dylan Berg MEDICAL RECORD NO: [id number] ACCOUNT NO: [id number] DATE OF BIRTH: 11-15-1955 FACILITY: [facility] LOCATION: [facility] PHYSICIAN: Doran Heater. Veverly Fells, MD  Operative Report   DATE OF PROCEDURE: 04/16/2022  PREOPERATIVE DIAGNOSES:  Right shoulder rotator cuff tear and early rotator cuff tear arthropathy.  POSTOPERATIVE DIAGNOSES:  Right shoulder rotator cuff tear and early rotator cuff tear arthropathy.  PROCEDURE PERFORMED:  Right reverse total shoulder arthroplasty using DePuy Delta Xtend prosthesis with subscapularis repair.  ATTENDING SURGEON:  Doran Heater. Veverly Fells, MD  ASSISTANT:  Charletta Cousin [place], [place], who was scrubbed during the entire procedure, and necessary for satisfactory completion of surgery.  ANESTHESIA:  General anesthesia was used plus interscalene block.  ESTIMATED BLOOD LOSS:  200 mL  FLUID REPLACEMENT:  1500 mL crystalloid.  COUNTS:  Instrument counts correct.  COMPLICATIONS:  No complications.  ANTIBIOTICS:  Perioperative antibiotics were given.  INDICATIONS:  The patient is a 67 year old male with worsening right shoulder pain and dysfunction secondary to rotator cuff tear as well as rotator cuff tear arthropathy.  The patient has failed an extended period of conservative management, desires  operative treatment in order to eliminate pain and restore function and quality of life.  Informed consent obtained.  DESCRIPTION OF PROCEDURE:  After an adequate level of anesthesia was achieved, the patient was positioned in the modified beach chair position.  Right shoulder correctly identified and sterilely prepped and draped in the usual manner.  Timeout called,  verifying correct patient, correct site. We entered the patient's shoulder using a standard deltopectoral approach, starting at the coracoid process and extending down to the anterior humeral shaft using a 10 blade scalpel.  Dissection down through  subcutaneous tissues using Bovie.  Cephalic vein was  identified and taken laterally with the deltoid, pectoralis taken medially.  Conjoined tendon identified and retracted medially.  Biceps tenodesed in situ with 0 Vicryl figure-of-eight suture x2.  We  then released the subscapularis subperiosteally off the lesser tuberosity and tagged with #2 FiberWire suture in a modified Mason-Allen suture technique for repair at the end.  We released the inferior capsule extending the shoulder and delivered the  humeral head out of the wound.  We entered the proximal humerus, which was devoid of cartilage with a 6 mm reamer, we reamed up to a size 12.  We then used the 12 mm T-handle guide and resected the head at 20 degrees of retroversion.  We then removed  excess osteophytes with a rongeur.  We subluxed the humerus posteriorly, gaining good exposure of the glenoid, removed the capsule and labrum, protecting the axillary nerve.  We then found the center point of the lower glenoid.  We did have to removal of  cartilage from the glenoid side.  Once we had that removed, we found our center point, placed our guide pin and then reamed for the metaglene baseplate down to subchondral bone.  We had that baseplate seated inferiorly and also tilted slightly  inferiorly.  We then did our peripheral hand reaming and then drilled our central peg hole.  We impacted the HA coated press-fit baseplate into position.  We then used a 36 screw inferiorly, a 42 screw superiorly and a 24 screw anteriorly.  We had three  good screws.  Baseplate was secured.  We locked the screws in position We then placed a 42+0 standard glenosphere on the baseplate and secured that with a screwdriver.  We did a finger sweep to make sure the soft tissues free and clear from  that.  We  then went to the humeral side and reamed for the 2 right metaphysis and we trialled with the 12 stem, 2 right metaphysis set on the 0 setting and placed in 20 degrees of retroversion.  With that stem in place, we selected a 42+3  poly trial and placed on  the humeral tray and reduced the shoulder.  We were happy with our soft tissue tensioning and balance and stability, felt like we could repair the subscap.  We removed the trial components.  We irrigated thoroughly, drilled holes in lesser tuberosity and  placed #2 FiberWire suture for repair.  We then used available bone graft from the humeral head and used impaction grafting technique to impact the HA coated press fit 12 stem and the HA coated 2 right metaphysis set on the 0 setting and impacted in 20  degrees of retroversion.  Excellent stem support and stability.  We then selected the real 42+3 polyethylene insert and placed on the humeral tray impacted that.  We reduced the shoulder.  Nice little pop as it reduced.  Appropriate tension on the  conjoined and no gapping with inferior pole or external rotation.  We irrigated thoroughly.  We then repaired the subscapularis anatomically back to the bone on lesser tuberosity and this did not restrict range of motion.  We had a good repair.  We  irrigated thoroughly and then repaired the deltopectoral interval with 0 Vicryl suture followed by 2-0 Vicryl for subcutaneous closure and 4-0 Monocryl for skin.  Steri-Strips applied followed by sterile dressing.  The patient tolerated surgery well.   PUS D: 04/16/2022 2:42:42 pm T: 04/16/2022 5:02:00 pm  JOB: [id number]/ [id number]

## 2022-04-16 NOTE — TOC CM/SW Note (Signed)
Transition of Care [facility] Screening Note  Patient Details  Name: Dylan Berg Date of Birth: 1955/04/22  Transition of Care [facility]) CM/SW Contact:    Sherie Don, LCSW Phone Number: 04/16/2022, 4:22 PM  Transition of Care Department [facility]) has reviewed patient and no TOC needs have been identified at this time. We will continue to monitor patient advancement through interdisciplinary progression rounds. If new patient transition needs arise, please place a TOC consult.

## 2022-04-16 NOTE — Brief Op Note (Signed)
04/16/2022  2:36 PM  PATIENT:  Dylan Berg  67 y.o. male  PRE-OPERATIVE DIAGNOSIS:  right shoulder rotator cuff tear, and rotator cuff tear arthropathy  POST-OPERATIVE DIAGNOSIS:  right shoulder rotator cuff tear, and rotator cuff tear arthropathy   PROCEDURE:  Procedure(s) with comments: REVERSE SHOULDER ARTHROPLASTY (Right) - 120 min choice and general  DePuy Delta Xtend with Subscap repair  SURGEON:  Surgeon(s) and Role:    Netta Cedars, MD - Primary  PHYSICIAN ASSISTANT:   ASSISTANTS: Ventura Bruns, PA-C   ANESTHESIA:   regional and general  EBL:  200 mL   BLOOD ADMINISTERED:none  DRAINS: none   LOCAL MEDICATIONS USED:  MARCAINE     SPECIMEN:  No Specimen  DISPOSITION OF SPECIMEN:  N/A  COUNTS:  YES  TOURNIQUET:  * No tourniquets in log *  DICTATION: .Other Dictation: Dictation Number [id number]  PLAN OF CARE: Admit for overnight observation  PATIENT DISPOSITION:  PACU - hemodynamically stable.   Delay start of Pharmacological VTE agent (>24hrs) due to surgical blood loss or risk of bleeding: not applicable

## 2022-04-16 NOTE — Interval H&P Note (Signed)
History and Physical Interval Note:  04/16/2022 11:33 AM  Dylan Berg  has presented today for surgery, with the diagnosis of right shoulder rotator cuff tear.  The various methods of treatment have been discussed with the patient and family. After consideration of risks, benefits and other options for treatment, the patient has consented to  Procedure(s) with comments: REVERSE SHOULDER ARTHROPLASTY (Right) - 120 min choice and general as a surgical intervention.  The patient's history has been reviewed, patient examined, no change in status, stable for surgery.  I have reviewed the patient's chart and labs.  Questions were answered to the patient's satisfaction.     Augustin Schooling

## 2022-04-16 NOTE — Transfer of Care (Signed)
Immediate Anesthesia Transfer of Care Note  Patient: Aydenn Gervin  Procedure(s) Performed: REVERSE SHOULDER ARTHROPLASTY (Right: Shoulder)  Patient Location: PACU  Anesthesia Type:General  Level of Consciousness: awake, alert , oriented, and patient cooperative  Airway & Oxygen Therapy: Patient Spontanous Breathing and Patient connected to face mask oxygen  Post-op Assessment: Report given to RN, Post -op Vital signs reviewed and stable, and Patient moving all extremities X 4  Post vital signs: Reviewed and stable  Last Vitals:  Vitals Value Taken Time  BP 170/93 04/16/22 1438  Temp    Pulse 43 04/16/22 1439  Resp 17 04/16/22 1439  SpO2 96 % 04/16/22 1439  Vitals shown include unvalidated device data.  Last Pain:  Vitals:   04/16/22 0916  TempSrc: Oral         Complications: No notable events documented.

## 2022-04-16 NOTE — Plan of Care (Signed)
  Problem: Pain Management: °Goal: Pain level will decrease with appropriate interventions °Outcome: Progressing °  °Problem: Coping: °Goal: Level of anxiety will decrease °Outcome: Progressing °  °Problem: Safety: °Goal: Ability to remain free from injury will improve °Outcome: Progressing °  °

## 2022-04-16 NOTE — Discharge Instructions (Signed)
Ice to the shoulder constantly.  Keep the incision covered and clean and dry for one week, then ok leave the incision open to air and to get it wet in the shower.  Do exercise as instructed several times per day.  DO NOT reach behind your back or push up out of a chair with the operative arm. Be gentle with your arm and keep it supported  Use a sling while you are up and around for comfort, may remove while seated.  Keep pillow propped behind the operative elbow.  Follow up with Dr Veverly Fells in two weeks in the office, call [phone] for app

## 2022-04-17 DIAGNOSIS — M75101 Unspecified rotator cuff tear or rupture of right shoulder, not specified as traumatic: Secondary | ICD-10-CM | POA: Diagnosis not present

## 2022-04-17 LAB — BASIC METABOLIC PANEL
Anion gap: 11 (ref 5–15)
BUN: 22 mg/dL (ref 8–23)
CO2: 21 mmol/L — ABNORMAL LOW (ref 22–32)
Calcium: 8.6 mg/dL — ABNORMAL LOW (ref 8.9–10.3)
Chloride: 104 mmol/L (ref 98–111)
Creatinine, Ser: 1.6 mg/dL — ABNORMAL HIGH (ref 0.61–1.24)
GFR, Estimated: 47 mL/min — ABNORMAL LOW (ref >60)
Glucose, Bld: 146 mg/dL — ABNORMAL HIGH (ref 70–99)
Potassium: 4.2 mmol/L (ref 3.5–5.1)
Sodium: 136 mmol/L (ref 135–145)

## 2022-04-17 LAB — HEMOGLOBIN AND HEMATOCRIT, BLOOD
HCT: 35.8 % — ABNORMAL LOW (ref 39.0–52.0)
Hemoglobin: 12 g/dL — ABNORMAL LOW (ref 13.0–17.0)

## 2022-04-17 NOTE — Plan of Care (Signed)
  Problem: Pain Management: Goal: Pain level will decrease with appropriate interventions 04/17/2022 0651 by Blase Mess, RN Outcome: Progressing 04/16/2022 2042 by Blase Mess, RN Outcome: Progressing

## 2022-04-17 NOTE — Progress Notes (Addendum)
Subjective: 1 Day Post-Op Procedure(s) (LRB): REVERSE SHOULDER ARTHROPLASTY (Right) Patient reports pain as mild to moderate.  Patient seen in rounds for Dr. Veverly Fells.    Objective: Vital signs in last 24 hours: Temp:  [97.6 F (36.4 C)-98.9 F (37.2 C)] 98.8 F (37.1 C) (01/13 0510) Pulse Rate:  [66-89] 86 (01/13 0510) Resp:  [11-19] 16 (01/13 0510) BP: (120-170)/(70-97) 137/83 (01/13 0510) SpO2:  [91 %-100 %] 97 % (01/13 0510) Weight:  [95.3 kg] 95.3 kg (01/12 0916)  Intake/Output from previous day: 01/12 0701 - 01/13 0700 In: 3237 [P.O.:960; I.V.:1827.1; IV Piggyback:449.9] Out: 650 [Urine:450; Blood:200] Intake/Output this shift: No intake/output data recorded.  Recent Labs    04/17/22 0521  HGB 12.0*   Recent Labs    04/17/22 0521  HCT 35.8*   Recent Labs    04/17/22 0521  NA 136  K 4.2  CL 104  CO2 21*  BUN 22  CREATININE 1.60*  GLUCOSE 146*  CALCIUM 8.6*   No results for input(s): "LABPT", "INR" in the last 72 hours.  Neurologically intact ABD soft Neurovascular intact No cellulitis present Compartment soft Distal radial and ulnar pulses 2+ RUE.  Bulky dressing. C/D/I.  Sensory and motor function intact in RUE.   Assessment/Plan: 1 Day Post-Op Procedure(s) (LRB): REVERSE SHOULDER ARTHROPLASTY (Right)   Patient to d/c home once cleared with PT.    Patient's anticipated LOS is less than 2 midnights, meeting these requirements: - Younger than 49 - Lives within 1 hour of care - Has a competent adult at home to recover with post-op recover - NO history of  - Chronic pain requiring opiods  - Diabetes  - Coronary Artery Disease  - Heart failure  - Heart attack  - Stroke  - DVT/VTE  - Cardiac arrhythmia  - Respiratory Failure/COPD  - Renal failure  - Anemia  - Advanced Liver disease     Charlott Rakes, PA-C 04/17/2022, 8:53 AM

## 2022-04-17 NOTE — Evaluation (Signed)
Occupational Therapy Evaluation Patient Details Name: Dylan Berg MRN: [id number] DOB: 13-Dec-1955 Today's Date: 04/17/2022   History of Present Illness Patient s/p right reverse shoulder arthroplasty   Clinical Impression   Mr. Jabin Tapp is a 67 year old man s/p shoulder replacement without functional use of right dominant upper extremity secondary to effects of surgery and interscalene block and shoulder precautions. Therapist provided education and instruction to patient in regards to exercises, precautions, positioning, donning upper extremity clothing and bathing while maintaining shoulder precautions, ice and edema management and donning/doffing sling. Patient verbalized understanding. Handouts provided. Patient has assistance of fiance at home. Patient to follow up with MD for further therapy needs.        Recommendations for follow up therapy are one component of a multi-disciplinary discharge planning process, led by the attending physician.  Recommendations may be updated based on patient status, additional functional criteria and insurance authorization.   Follow Up Recommendations  Follow physician's recommendations for discharge plan and follow up therapies     Assistance Recommended at Discharge Intermittent Supervision/Assistance  Patient can return home with the following A little help with bathing/dressing/bathroom;Assistance with cooking/housework    Functional Status Assessment  Patient has had a recent decline in their functional status and demonstrates the ability to make significant improvements in function in a reasonable and predictable amount of time.  Equipment Recommendations  None recommended by OT    Recommendations for Other Services       Precautions / Restrictions Precautions Precautions: Shoulder Type of Shoulder Precautions: No active ROM, No passive ROM Shoulder Interventions: Shoulder sling/immobilizer;Off for  dressing/bathing/exercises Precaution Booklet Issued:  (handouts) Required Braces or Orthoses: Sling Restrictions Weight Bearing Restrictions: Yes RUE Weight Bearing: Non weight bearing      Mobility Bed Mobility Overal bed mobility: Independent                  Transfers Overall transfer level: Independent                        Balance Overall balance assessment: No apparent balance deficits (not formally assessed)                                         ADL either performed or assessed with clinical judgement   ADL Overall ADL's : Needs assistance/impaired Eating/Feeding: Set up   Grooming: Modified independent   Upper Body Bathing: Moderate assistance;Sitting   Lower Body Bathing: Modified independent   Upper Body Dressing : Moderate assistance   Lower Body Dressing: Minimal assistance Lower Body Dressing Details (indicate cue type and reason): for shoe laces Toilet Transfer: Independent   Toileting- Clothing Manipulation and Hygiene: Modified independent       Functional mobility during ADLs: Modified independent       Vision Baseline Vision/History: 1 Wears glasses Patient Visual Report: No change from baseline       Perception     Praxis      Pertinent Vitals/Pain Pain Assessment Pain Assessment: Faces Faces Pain Scale: Hurts even more Pain Location: R shoulder Pain Descriptors / Indicators: Grimacing, Guarding Pain Intervention(s): Monitored during session, Patient requesting pain meds-RN notified     Hand Dominance     Extremity/Trunk Assessment Upper Extremity Assessment Upper Extremity Assessment: RUE deficits/detail RUE Deficits / Details: impaired sensation and motor control secondary to block   Lower Extremity  Assessment Lower Extremity Assessment: Overall WFL for tasks assessed   Cervical / Trunk Assessment Cervical / Trunk Assessment: Normal   Communication     Cognition Arousal/Alertness:  Awake/alert Behavior During Therapy: WFL for tasks assessed/performed Overall Cognitive Status: Within Functional Limits for tasks assessed                                       General Comments       Exercises     Shoulder Instructions Shoulder Instructions Donning/doffing shirt without moving shoulder: Patient able to independently direct caregiver Method for sponge bathing under operated UE: Patient able to independently direct caregiver Donning/doffing sling/immobilizer: Patient able to independently direct caregiver Correct positioning of sling/immobilizer: Patient able to independently direct caregiver ROM for elbow, wrist and digits of operated UE: Patient able to independently direct caregiver Sling wearing schedule (on at all times/off for ADL's): Patient able to independently direct caregiver Proper positioning of operated UE when showering: Patient able to independently direct caregiver Dressing change: Patient able to independently direct caregiver Positioning of UE while sleeping: Patient able to independently direct caregiver    Home Living Family/patient expects to be discharged to:: Private residence Living Arrangements: Spouse/significant other                                      Prior Functioning/Environment                          OT Problem List: Decreased strength;Decreased range of motion;Impaired UE functional use;Pain      OT Treatment/Interventions:      OT Goals(Current goals can be found in the care plan section) Acute Rehab OT Goals OT Goal Formulation: All assessment and education complete, DC therapy  OT Frequency:      Co-evaluation              AM-PAC OT "6 Clicks" Daily Activity     Outcome Measure Help from another person eating meals?: None Help from another person taking care of personal grooming?: None Help from another person toileting, which includes using toliet, bedpan, or urinal?:  None Help from another person bathing (including washing, rinsing, drying)?: A Little Help from another person to put on and taking off regular upper body clothing?: A Lot Help from another person to put on and taking off regular lower body clothing?: A Little 6 Click Score: 20   End of Session Nurse Communication:  (OT education complete)  Activity Tolerance: Patient tolerated treatment well Patient left: in chair;with call bell/phone within reach  OT Visit Diagnosis: Pain                Time: 4496-7591 OT Time Calculation (min): 18 min Charges:  OT General Charges $OT Visit: 1 Visit OT Evaluation $OT Eval Low Complexity: 1 Low  Gustavo Lah, OTR/L [place]  Office [phone]   Lenward Chancellor 04/17/2022, 10:53 AM

## 2022-04-17 NOTE — Discharge Summary (Addendum)
Patient ID: Dylan Berg MRN: [id number] DOB/AGE: 1956/02/29 67 y.o.  Admit date: 04/16/2022 Discharge date: 04/17/2022  Admission Diagnoses:  right shoulder cuff arthropathy   Discharge Diagnoses:  Principal Problem:   S/P shoulder replacement, right   Past Medical History:  Diagnosis Date   Acid reflux    Chronic kidney disease    Enlarged prostate    Hypertension    Pneumonia    as a child   SVT (supraventricular tachycardia)     Surgeries: Procedure(s): REVERSE SHOULDER ARTHROPLASTY on 04/16/2022   Consultants:   Discharged Condition: Improved  Hospital Course: Dylan Berg is an 67 y.o. male who was admitted 04/16/2022 for operative treatment ofS/P shoulder replacement, right. Patient has severe unremitting pain that affects sleep, daily activities, and work/hobbies. After pre-op clearance the patient was taken to the operating room on 04/16/2022 and underwent  Procedure(s): [place].    Patient was given perioperative antibiotics:  Anti-infectives (From admission, onward)    Start     Dose/Rate Route Frequency Ordered Stop   04/16/22 1800  ceFAZolin (ANCEF) IVPB 2g/100 mL premix        2 g 200 mL/hr over 30 Minutes Intravenous Every 6 hours 04/16/22 1618 04/17/22 0710   04/16/22 0915  ceFAZolin (ANCEF) IVPB 2g/100 mL premix  Status:  Discontinued        2 g 200 mL/hr over 30 Minutes Intravenous On call to O.R. 04/16/22 0905 04/16/22 1607        Patient was given sling. Patient worked with PT and was meeting their goals regarding safe ambulation  and exercises to do, and maintaining lifting precautions in sling.   Patient benefited maximally from hospital stay and there were no complications.    Recent vital signs: Patient Vitals for the past 24 hrs:  BP Temp Temp src Pulse Resp SpO2  04/17/22 0955 (!) 171/93 98.5 F (36.9 C) Oral 71 17 97 %  04/17/22 0941 -- -- -- -- -- 95 %  04/17/22 0510 137/83 98.8 F (37.1 C) Oral 86 16 97 %   04/17/22 0216 (!) 149/97 98.5 F (36.9 C) Oral 75 17 96 %  04/16/22 2333 -- -- -- -- -- 95 %  04/16/22 2118 138/80 97.6 F (36.4 C) Oral 77 17 91 %  04/16/22 1943 -- -- -- -- -- 100 %  04/16/22 1745 (!) 141/79 98.9 F (37.2 C) Oral 89 -- 97 %  04/16/22 1557 136/77 -- -- 74 11 96 %  04/16/22 1545 -- 98.9 F (37.2 C) -- -- -- --  04/16/22 1530 120/71 -- -- 74 15 91 %  04/16/22 1515 125/73 -- -- 77 16 96 %  04/16/22 1500 -- -- -- 77 -- --  04/16/22 1445 123/74 -- -- 86 18 97 %  04/16/22 1438 (!) 170/93 97.8 F (36.6 C) -- 82 14 96 %  04/16/22 1155 -- -- -- 80 18 97 %  04/16/22 1150 (!) 144/85 -- -- 81 13 94 %  04/16/22 1145 (!) 148/87 -- -- 66 19 97 %     Recent laboratory studies:  Recent Labs    04/17/22 0521  HGB 12.0*  HCT 35.8*  NA 136  K 4.2  CL 104  CO2 21*  BUN 22  CREATININE 1.60*  GLUCOSE 146*  CALCIUM 8.6*     Discharge Medications:   Allergies as of 04/17/2022   No Known Allergies      Medication List     TAKE these medications  albuterol 108 (90 Base) MCG/ACT inhaler Commonly known as: VENTOLIN HFA Inhale 1-2 puffs into the lungs every 6 (six) hours as needed for wheezing or shortness of breath.   amLODipine 10 MG tablet Commonly known as: NORVASC Take 10 mg by mouth daily.   atorvastatin 10 MG tablet Commonly known as: LIPITOR Take 10 mg by mouth daily.   famotidine 20 MG tablet Commonly known as: PEPCID Take 20 mg by mouth 2 (two) times daily.   Flovent HFA 110 MCG/ACT inhaler Generic drug: fluticasone Inhale 1 puff into the lungs in the morning and at bedtime.   hydrochlorothiazide 25 MG tablet Commonly known as: HYDRODIURIL Take 25 mg by mouth daily.   LORazepam 0.5 MG tablet Commonly known as: Ativan Take 1 tablet 1 hour before MRI and 1 immediately before scan if needed.  Must have a driver.   methocarbamol 500 MG tablet Commonly known as: ROBAXIN Take 1 tablet (500 mg total) by mouth every 8 (eight) hours as needed  for muscle spasms.   ondansetron 4 MG tablet Commonly known as: Zofran Take 1 tablet (4 mg total) by mouth every 8 (eight) hours as needed for nausea, vomiting or refractory nausea / vomiting.   oxyCODONE-acetaminophen 5-325 MG tablet Commonly known as: Percocet Take 1 tablet by mouth every 4 (four) hours as needed for severe pain or moderate pain.   tamsulosin 0.4 MG Caps capsule Commonly known as: FLOMAX Take 1 capsule (0.4 mg total) by mouth daily.   traMADol 50 MG tablet Commonly known as: ULTRAM Take 1 tablet by mouth every 6 (six) hours as needed for moderate pain.        Diagnostic Studies: DG Shoulder Right Port  Result Date: 04/16/2022 CLINICAL DATA:  Status post reverse total right shoulder arthroplasty. EXAM: RIGHT SHOULDER - 1 VIEW COMPARISON:  Right shoulder radiograph dated 09/13/2021 FINDINGS: Postsurgical changes from reverse total right shoulder arthroplasty. Hardware appears intact. IMPRESSION: Postsurgical changes from reverse total right shoulder arthroplasty. Electronically Signed   By: Agustin Cree M.D.   On: 04/16/2022 15:51    Disposition: Discharge disposition: 01-Home or Self Care       Discharge Instructions     Call MD / Call 911   Complete by: As directed    If you experience chest pain or shortness of breath, CALL 911 and be transported to the hospital emergency room.  If you develope a fever above 101 F, pus (white drainage) or increased drainage or redness at the wound, or calf pain, call your surgeon's office.   Constipation Prevention   Complete by: As directed    Drink plenty of fluids.  Prune juice may be helpful.  You may use a stool softener, such as Colace (over the counter) 100 mg twice a day.  Use MiraLax (over the counter) for constipation as needed.   Diet - low sodium heart healthy   Complete by: As directed    Discharge instructions   Complete by: As directed    No not change your dressing. Please wear your sling.   Driving  restrictions   Complete by: As directed    No driving.   Increase activity slowly as tolerated   Complete by: As directed    Ice to the shoulder constantly.  Keep the incision covered and clean and dry for one week, then ok leave the incision open to air and to get it wet in the shower.  Do exercise as instructed several times per day.  DO NOT  reach behind your back or push up out of a chair with the operative arm. Be gentle with your arm and keep it supported  Use a sling while you are up and around for comfort, may remove while seated.  Keep pillow propped behind the operative elbow.   Lifting restrictions   Complete by: As directed    No lifting.   Post-operative opioid taper instructions:   Complete by: As directed    POST-OPERATIVE OPIOID TAPER INSTRUCTIONS: It is important to wean off of your opioid medication as soon as possible. If you do not need pain medication after your surgery it is ok to stop day one. Opioids include: Codeine, Hydrocodone(Norco, Vicodin), Oxycodone(Percocet, oxycontin) and hydromorphone amongst others.  Long term and even short term use of opiods can cause: Increased pain response Dependence Constipation Depression Respiratory depression And more.  Withdrawal symptoms can include Flu like symptoms Nausea, vomiting And more Techniques to manage these symptoms Hydrate well Eat regular healthy meals Stay active Use relaxation techniques(deep breathing, meditating, yoga) Do Not substitute Alcohol to help with tapering If you have been on opioids for less than two weeks and do not have pain than it is ok to stop all together.  Plan to wean off of opioids This plan should start within one week post op of your joint replacement. Maintain the same interval or time between taking each dose and first decrease the dose.  Cut the total daily intake of opioids by one tablet each day Next start to increase the time between doses. The last dose that should  be eliminated is the evening dose.           Follow-up Information     Netta Cedars, MD. Call in 2 week(s).   Specialty: Orthopedic Surgery Why: call [phone] for appt Contact information: [address] [place] [address] [phone]                  Signed: Charlott Rakes, PA-C 04/17/2022, 10:12 AM

## 2022-04-17 NOTE — Plan of Care (Signed)
  Problem: Education: Goal: Knowledge of the prescribed therapeutic regimen will improve Outcome: Progressing   Problem: Activity: Goal: Ability to tolerate increased activity will improve Outcome: Progressing   Problem: Pain Management: Goal: Pain level will decrease with appropriate interventions Outcome: Progressing   

## 2022-04-17 NOTE — Progress Notes (Signed)
Pt alert and oriented. Surgical dressing clean, dry and intact. No questions or queries regarding discharge instructions. Belongings sent home with pt.  

## 2022-04-18 NOTE — Anesthesia Postprocedure Evaluation (Signed)
Anesthesia Post Note  Patient: Dylan Berg  Procedure(s) Performed: REVERSE SHOULDER ARTHROPLASTY (Right: Shoulder)     Patient location during evaluation: PACU Anesthesia Type: General and Regional Level of consciousness: awake and alert Pain management: pain level controlled Vital Signs Assessment: post-procedure vital signs reviewed and stable Respiratory status: spontaneous breathing, nonlabored ventilation and respiratory function stable Cardiovascular status: blood pressure returned to baseline and stable Postop Assessment: no apparent nausea or vomiting Anesthetic complications: no   No notable events documented.  Last Vitals:  Vitals:   04/17/22 0941 04/17/22 0955  BP:  (!) 171/93  Pulse:  71  Resp:  17  Temp:  36.9 C  SpO2: 95% 97%    Last Pain:  Vitals:   04/17/22 0955  TempSrc: Oral  PainSc:                  Tracie Dore

## 2022-04-19 ENCOUNTER — Encounter (HOSPITAL_COMMUNITY): Payer: Self-pay | Admitting: Orthopedic Surgery

## 2022-05-17 ENCOUNTER — Other Ambulatory Visit: Payer: Self-pay | Admitting: Urology

## 2022-05-17 DIAGNOSIS — R972 Elevated prostate specific antigen [PSA]: Secondary | ICD-10-CM

## 2022-06-08 ENCOUNTER — Ambulatory Visit
Admission: RE | Admit: 2022-06-08 | Discharge: 2022-06-08 | Disposition: A | Discharge status: Home / Self Care | Payer: 59 | Source: Ambulatory Visit | Attending: Urology | Admitting: Urology

## 2022-06-08 DIAGNOSIS — R972 Elevated prostate specific antigen [PSA]: Secondary | ICD-10-CM

## 2022-07-09 ENCOUNTER — Other Ambulatory Visit: Payer: 59

## 2022-08-02 ENCOUNTER — Ambulatory Visit
Admission: RE | Admit: 2022-08-02 | Discharge: 2022-08-02 | Disposition: A | Discharge status: Home / Self Care | Payer: 59 | Source: Ambulatory Visit | Attending: Urology | Admitting: Urology

## 2022-08-02 MED ORDER — GADOPICLENOL 0.5 MMOL/ML IV SOLN
10.0000 mL | Freq: Once | INTRAVENOUS | Status: AC | PRN
  Administered 2022-08-02: 10 mL via INTRAVENOUS

## 2022-08-11 ENCOUNTER — Other Ambulatory Visit (HOSPITAL_COMMUNITY): Payer: Self-pay | Admitting: Urology

## 2022-08-11 ENCOUNTER — Other Ambulatory Visit: Payer: Self-pay | Admitting: Urology

## 2022-08-11 DIAGNOSIS — R972 Elevated prostate specific antigen [PSA]: Secondary | ICD-10-CM

## 2022-08-12 ENCOUNTER — Encounter (HOSPITAL_BASED_OUTPATIENT_CLINIC_OR_DEPARTMENT_OTHER): Payer: Self-pay | Admitting: Urology

## 2022-08-12 NOTE — Progress Notes (Signed)
Spoke w/ via phone for pre-op interview--- pt Lab needs dos----    [employer] results------ current EKG in epic/ chart COVID test -----patient states asymptomatic no test needed Arrive at -------  0800 on 08-17-2022 NPO after MN NO Solid Food.  Clear liquids from MN until--- 0700 Med rec completed Medications to take morning of surgery ----- gabapentin, lipitor, norvasc, flomax, pepcid, flovent inhlaler Diabetic medication ----- n/a Patient instructed no nail polish to be worn day of surgery Patient instructed to bring photo id and insurance card day of surgery Patient aware to have Driver (ride ) / caregiver    for 24 hours after surgery -- sig other, teon Patient Special Instructions ----- will do one fleet enema morning of surgery Pre-Op special Instructions ----- n/a Patient verbalized understanding of instructions that were given at this phone interview. Patient denies shortness of breath, chest pain, fever, cough at this phone interview.   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 Roxan Hockey Cardiologist : Dr Anne Fu [facility] 12-28-2021) Chest x-ray : no EKG : 12-28-2021 Echo : 03-13-2015  [facility] Stress test:  nuclear 03-17-2015 [facility] Monitor:  01-19-2022 epic Cardiac Cath :  no Activity level: denies sob w/ any activity

## 2022-08-16 ENCOUNTER — Encounter (HOSPITAL_BASED_OUTPATIENT_CLINIC_OR_DEPARTMENT_OTHER): Payer: Self-pay | Admitting: Urology

## 2022-08-16 NOTE — Progress Notes (Signed)
Spoke w/ via phone for pre-op interview--- Dylan Berg  Lab needs dos---- ISTAT              Lab results------Current EKG in chart and in Epic COVID test -----patient states asymptomatic no test needed Arrive at -------0800 NPO after MN NO Solid Food.  Clear liquids from MN until---0700 Med rec completed Medications to take morning of surgery -----Gabapentin, Lipitor, Norvasc, Flomax, Pepcid and Flovent  Diabetic medication ----- Patient instructed no nail polish to be worn day of surgery Patient instructed to bring photo id and insurance card day of surgery Patient aware to have Driver (ride ) / caregiver  Dylan Berg fiance  for 24 hours after surgery  Patient Special Instructions ----- FLEETS enema AM of surgery. Pre-Op special Instructions ----- Patient verbalized understanding of instructions that were given at this phone interview. Patient denies shortness of breath, chest pain, fever, cough at this phone interview.

## 2022-08-17 ENCOUNTER — Ambulatory Visit (HOSPITAL_COMMUNITY): Payer: 59

## 2022-08-17 DIAGNOSIS — Z01818 Encounter for other preprocedural examination: Secondary | ICD-10-CM

## 2022-08-23 NOTE — Progress Notes (Signed)
Patient notified of time change from 0800 to 0730. Clear liquids until 0630

## 2022-08-23 NOTE — H&P (Signed)
PSA of 9.8 on 10/02/2020 at PCP office. Does have history of BPH managed with tamsulosin 0.4 mg daily and also takes super beta prostate over-the-counter. Had been seen by urologist in the [place] area in 2019 and underwent what sounds like a transperineal prostate biopsy in 2019 which he states was negative for cancer. I do not have any other records nor do I have PSA values from there. Patient is not aware of any family history of prostate cancer. The patient has minimal voiding symptoms when taking tamsulosin.  Post void residuals 31 cc  Micro urinalysis is clear on urine spun sediment  -10/28/21-patient with history of elevated PSA underwent negative prostate biopsy in the [place] area in 2019. PSA in January 2023 was 8.14. Patient came by and got PSA drawn earlier in the week but had to redraw due to lab difficulty. Current level not back from yesterday. Other urologic issues have some mild ED issues where he had difficulty achieving and maintaining erections over the last several months. He has tried generic sildenafil in the past with good success. He takes no nitroglycerin products  Micro urinalysis clear on urine spun sediment  Postvoid residual equals: 78 cc   -05/10/22-patient with history of elevated PSA with negative prostate biopsy in the [place] area in 2019 by report. PSA in January 23 was 8.14. Repeat value on 10/27/2021 was 6.03. We opted for surveillance. His most recent PSA is now 10.80 on 05/05/2022. No significant voiding issues. Patient does desire refill on sildenafil for erectile dysfunction as this works well for him.  Micro urinalysis clear on urine spun sediment  -08/09/22-patient with history of elevated PSA with negative prostate biopsy in the [place] area in 2019 by report. Most recent PSA up to 10.80 on 05/05/2022. Opted for MRI of the prostate. Here to discuss results. MRI of the prostate showed prostamegaly with volume of 53 cc. No high-grade or intermediate lesions noted.     CLINICAL DATA: Elevated PSA level of 10.8 on 05/05/2022. Reportedly  negative/benign biopsy [place] state.   EXAM:  MR PROSTATE WITHOUT AND WITH CONTRAST   TECHNIQUE:  Multiplanar multisequence MRI images were obtained of the pelvis  centered about the prostate. Pre and post contrast images were  obtained.   CONTRAST: 10 cc Vueway   COMPARISON: None Available.   FINDINGS:  Prostate: Encapsulated nodularity in the transition zone compatible  with benign prostatic hypertrophy.   Hazy low T2 signal in the peripheral zone is nonfocal, likely  postinflammatory, and is considered PI-RADS category 2.   No focal lesion of intermediate or higher suspicion for prostate  cancer is identified. No significant abnormal restriction of  diffusion.   Volume: Ellipsoid volume calculation: 5.2 by 4.0 by 4.9 cm (volume =  53 cm^3)   Transcapsular spread: Absent   Seminal vesicle involvement: Absent   Neurovascular bundle involvement: Absent   Pelvic adenopathy: Absent   Bone metastasis: Absent   Other findings: No other significant findings.   IMPRESSION:  1. No focal lesion of intermediate or higher suspicion for prostate  cancer is identified.  2. Prostatomegaly and benign prostatic hypertrophy.    Electronically Signed  By: Gaylyn Rong M.D.  On: 08/03/2022 09:05      ALLERGIES: No Allergies    MEDICATIONS: Tamsulosin Hcl 0.4 mg capsule 1 capsule PO Daily  Valium 10 mg tablet Take 1 tab po 1 hour prior to procedure  Albuterol Sulfate Hfa 90 mcg hfa aerosol with adapter 1 PO Daily  Amlodipine Besylate 10 mg tablet 1 tablet PO Daily  Famotidine 20 mg tablet 1 tablet PO Daily  Hydrochlorothiazide 25 mg tablet 1 tablet PO Daily  Proair Hfa 90 mcg hfa aerosol with adapter 1 PO Daily  Sildenafil Citrate 20 mg tablet Take 1-5 tabs po daily prn ED  Sildenafil Citrate 20 mg tablet Take 1-5 tabs po daily prn ED  Tamsulosin Hcl 0.4 mg capsule 1 capsule PO Daily   Verapamil Er 120 mg tablet, extended release 1 tablet PO Daily     GU PSH: Biopsy Prostate     NON-GU PSH: Anesth, Shoulder Replacement, Right Back surgery     GU PMH: BPH w/LUTS - 05/10/2022, - 10/28/2021, - 04/09/2021 ED due to arterial insufficiency - 05/10/2022, - 10/28/2021 Elevated PSA - 05/10/2022, - 10/28/2021, - 04/09/2021 Weak Urinary Stream - 05/10/2022, - 10/28/2021, - 04/09/2021    NON-GU PMH: Gout Hypertension    FAMILY HISTORY: No Family History    SOCIAL HISTORY: Marital Status: Single Ethnicity: Not Hispanic Or Latino; Race: Black or African American Current Smoking Status: Patient smokes. Has smoked since 04/05/2009. Smokes 1 pack per day.   Tobacco Use Assessment Completed: Used Tobacco in last 30 days? Drinks 2 drinks per day.  Does not use drugs. Does not drink caffeine.    REVIEW OF SYSTEMS:    GU Review Male:   Patient denies frequent urination, hard to postpone urination, burning/ pain with urination, get up at night to urinate, leakage of urine, stream starts and stops, trouble starting your stream, have to strain to urinate , erection problems, and penile pain.  Gastrointestinal (Upper):   Patient denies vomiting, nausea, and indigestion/ heartburn.  Gastrointestinal (Lower):   Patient denies diarrhea and constipation.  Constitutional:   Patient denies fever, night sweats, weight loss, and fatigue.  Skin:   Patient denies skin rash/ lesion and itching.  Eyes:   Patient denies blurred vision and double vision.  Ears/ Nose/ Throat:   Patient denies sore throat and sinus problems.  Hematologic/Lymphatic:   Patient denies swollen glands and easy bruising.  Cardiovascular:   Patient denies leg swelling and chest pains.  Respiratory:   Patient denies cough and shortness of breath.  Endocrine:   Patient denies excessive thirst.  Musculoskeletal:   Patient denies back pain and joint pain.  Neurological:   Patient denies headaches and dizziness.  Psychologic:    Patient denies depression and anxiety.   VITAL SIGNS: None   Complexity of Data:   05/05/22 10/27/21 04/09/21  PSA  Total PSA 10.80 ng/mL 6.03 ng/mL 8.14 ng/mL  Free PSA  1.53 ng/mL 1.38 ng/mL  % Free PSA  25 % PSA 17 % PSA    PROCEDURES:          Urinalysis w/Scope Dipstick Dipstick Cont'd Micro  Color: Straw Bilirubin: Neg mg/dL WBC/hpf: 0 - 5/hpf  Appearance: Clear Ketones: Trace mg/dL RBC/hpf: 0 - 2/hpf  Specific Gravity: 1.015 Blood: Neg ery/uL Bacteria: Rare (0-9/hpf)  pH: <=5.0 Protein: Neg mg/dL Cystals: NS (Not Seen)  Glucose: Neg mg/dL Urobilinogen: 0.2 mg/dL Casts: NS (Not Seen)    Nitrites: Neg Trichomonas: Not Present    Leukocyte Esterase: Trace leu/uL Mucous: Present      Epithelial Cells: NS (Not Seen)      Yeast: NS (Not Seen)      Sperm: Not Present    ASSESSMENT:      ICD-10 Details  1 GU:   Elevated PSA - R97.20 Acute, Complicated Injury  2  ED due to arterial insufficiency - N52.01 Chronic, Stable  3   BPH w/LUTS - N40.1 Chronic, Stable  4   Weak Urinary Stream - R39.12 Chronic, Stable     PLAN:            Medications New Meds: Sildenafil Citrate 100 mg tablet 1 tablet PO Daily PRN   #30  6 Refill(s)  Pharmacy Name:  [id number]  Address:  [address]   [place], [place] [id number]  Phone:  [phone]  Fax:  [phone]            Document Letter(s):  Created for Patient: Clinical Summary         Notes:   MRI results showing no obvious high-grade prostate lesions. I think he needs to have transrectal ultrasound and prostate biopsy with random biopsies. Patient is unable to tolerate exam with index finger and therefore would not be able to tolerate transrectal biopsy here under local anesthesia. Would recommend doing this under at least MAC anesthesia at the outpatient surgical center. Risk and benefits procedure were discussed as outlined below. Will schedule accordingly in the near future. Prostate biopsy consent:  The patient and I discussed the risks, benefits and alternatives to prostate biopsy. The risks of prostate biopsy can include but are not limited to the following: Blood in the urine, stool or ejaculate for several days to months potentially severe infection(sepsis), in up to 4% of patients, discomfort or pain and urinary retention. Postprocedure lead the patient has been instructed to go to the emergency room or call the clinic if he experiences any flu-like symptoms including generalized malaise, fevers, chills, nausea or vomiting. We discussed the small risk of erectile dysfunction long-term. He he is asked to stop all anticoagulation medications 1 week prior to the biopsy. We also talked about the pathology processing taking about 1 week and I will call the patient with the pathology results at that point. We have provided the patient with an instruction sheet and preprocedural antibiotics. I have asked him to pick up a Fleet's enema over-the-counter and use approximately 1 hour prior to his procedure. After complete discussion he has consented to the procedure. He will come back at the next available appointment time for his biopsy.

## 2022-08-24 ENCOUNTER — Ambulatory Visit (HOSPITAL_BASED_OUTPATIENT_CLINIC_OR_DEPARTMENT_OTHER): Payer: 59

## 2022-08-24 ENCOUNTER — Encounter (HOSPITAL_BASED_OUTPATIENT_CLINIC_OR_DEPARTMENT_OTHER)
Admission: RE | Disposition: A | Discharge status: Home / Self Care | Payer: Self-pay | Source: Home / Self Care | Attending: Urology

## 2022-08-24 ENCOUNTER — Ambulatory Visit (HOSPITAL_COMMUNITY)
Admission: RE | Admit: 2022-08-24 | Discharge: 2022-08-24 | Disposition: A | Discharge status: Home / Self Care | Payer: 59 | Source: Ambulatory Visit | Attending: Urology | Admitting: Urology

## 2022-08-24 ENCOUNTER — Ambulatory Visit (HOSPITAL_BASED_OUTPATIENT_CLINIC_OR_DEPARTMENT_OTHER)
Admission: RE | Admit: 2022-08-24 | Discharge: 2022-08-24 | Disposition: A | Discharge status: Home / Self Care | Payer: 59 | Attending: Urology | Admitting: Urology

## 2022-08-24 ENCOUNTER — Encounter (HOSPITAL_BASED_OUTPATIENT_CLINIC_OR_DEPARTMENT_OTHER): Payer: Self-pay | Admitting: Urology

## 2022-08-24 DIAGNOSIS — C61 Malignant neoplasm of prostate: Secondary | ICD-10-CM | POA: Insufficient documentation

## 2022-08-24 DIAGNOSIS — Z79899 Other long term (current) drug therapy: Secondary | ICD-10-CM | POA: Diagnosis not present

## 2022-08-24 DIAGNOSIS — R972 Elevated prostate specific antigen [PSA]: Secondary | ICD-10-CM

## 2022-08-24 DIAGNOSIS — F17210 Nicotine dependence, cigarettes, uncomplicated: Secondary | ICD-10-CM | POA: Diagnosis not present

## 2022-08-24 DIAGNOSIS — N5201 Erectile dysfunction due to arterial insufficiency: Secondary | ICD-10-CM | POA: Insufficient documentation

## 2022-08-24 DIAGNOSIS — I10 Essential (primary) hypertension: Secondary | ICD-10-CM | POA: Diagnosis not present

## 2022-08-24 DIAGNOSIS — Z87891 Personal history of nicotine dependence: Secondary | ICD-10-CM

## 2022-08-24 DIAGNOSIS — N400 Enlarged prostate without lower urinary tract symptoms: Secondary | ICD-10-CM | POA: Diagnosis present

## 2022-08-24 DIAGNOSIS — Z01818 Encounter for other preprocedural examination: Secondary | ICD-10-CM

## 2022-08-24 HISTORY — DX: Chronic kidney disease, stage 3 unspecified: N18.30

## 2022-08-24 HISTORY — DX: Presence of spectacles and contact lenses: Z97.3

## 2022-08-24 HISTORY — DX: Other specified cardiac arrhythmias: I49.8

## 2022-08-24 HISTORY — DX: Personal history of other diseases of the circulatory system: Z86.79

## 2022-08-24 HISTORY — DX: Unspecified chronic bronchitis: J42

## 2022-08-24 HISTORY — DX: Atrial premature depolarization: I49.1

## 2022-08-24 HISTORY — DX: Benign prostatic hyperplasia without lower urinary tract symptoms: N40.0

## 2022-08-24 HISTORY — DX: Benign prostatic hyperplasia without lower urinary tract symptoms: R97.20

## 2022-08-24 HISTORY — DX: Unspecified osteoarthritis, unspecified site: M19.90

## 2022-08-24 HISTORY — PX: PROSTATE BIOPSY: SHX241

## 2022-08-24 HISTORY — DX: Personal history of other diseases of the musculoskeletal system and connective tissue: Z87.39

## 2022-08-24 HISTORY — DX: Gastro-esophageal reflux disease without esophagitis: K21.9

## 2022-08-24 LAB — POCT I-STAT, CHEM 8
BUN: 38 mg/dL — ABNORMAL HIGH (ref 8–23)
Calcium, Ion: 1.27 mmol/L (ref 1.15–1.40)
Chloride: 112 mmol/L — ABNORMAL HIGH (ref 98–111)
Creatinine, Ser: 1.8 mg/dL — ABNORMAL HIGH (ref 0.61–1.24)
Glucose, Bld: 90 mg/dL (ref 70–99)
HCT: 39 % (ref 39.0–52.0)
Hemoglobin: 13.3 g/dL (ref 13.0–17.0)
Potassium: 5.2 mmol/L — ABNORMAL HIGH (ref 3.5–5.1)
Sodium: 141 mmol/L (ref 135–145)
TCO2: 22 mmol/L (ref 22–32)

## 2022-08-24 SURGERY — BIOPSY, PROSTATE, RECTAL APPROACH, WITH US GUIDANCE
Anesthesia: Monitor Anesthesia Care | Site: Rectum

## 2022-08-24 MED ORDER — FENTANYL CITRATE (PF) 100 MCG/2ML IJ SOLN
25.0000 ug | INTRAMUSCULAR | Status: DC | PRN
  Administered 2022-08-24: 25 ug via INTRAVENOUS

## 2022-08-24 MED ORDER — ONDANSETRON HCL 4 MG/2ML IJ SOLN: 4.0000 mg | Freq: Once | INTRAMUSCULAR | Status: DC | PRN

## 2022-08-24 MED ORDER — LIDOCAINE HCL 2 % IJ SOLN
INTRAMUSCULAR | Status: DC | PRN
  Administered 2022-08-24: 10 mL

## 2022-08-24 MED ORDER — CIPROFLOXACIN IN D5W 400 MG/200ML IV SOLN
400.0000 mg | INTRAVENOUS | Status: AC
  Administered 2022-08-24: 400 mg via INTRAVENOUS

## 2022-08-24 MED ORDER — PROPOFOL 500 MG/50ML IV EMUL
INTRAVENOUS | Status: AC
  Filled 2022-08-24: qty 50

## 2022-08-24 MED ORDER — CIPROFLOXACIN IN D5W 400 MG/200ML IV SOLN
INTRAVENOUS | Status: AC
  Filled 2022-08-24: qty 200

## 2022-08-24 MED ORDER — MIDAZOLAM HCL 5 MG/5ML IJ SOLN
INTRAMUSCULAR | Status: DC | PRN
  Administered 2022-08-24: 2 mg via INTRAVENOUS

## 2022-08-24 MED ORDER — OXYCODONE HCL 5 MG PO TABS
5.0000 mg | ORAL_TABLET | Freq: Once | ORAL | Status: AC
  Administered 2022-08-24: 5 mg via ORAL

## 2022-08-24 MED ORDER — FLEET ENEMA 7-19 GM/118ML RE ENEM: 1.0000 | ENEMA | Freq: Once | RECTAL | Status: DC

## 2022-08-24 MED ORDER — PROPOFOL 10 MG/ML IV BOLUS
INTRAVENOUS | Status: DC | PRN
  Administered 2022-08-24: 40 mg via INTRAVENOUS

## 2022-08-24 MED ORDER — MIDAZOLAM HCL 2 MG/2ML IJ SOLN
INTRAMUSCULAR | Status: AC
  Filled 2022-08-24: qty 2

## 2022-08-24 MED ORDER — FENTANYL CITRATE (PF) 100 MCG/2ML IJ SOLN
INTRAMUSCULAR | Status: AC
  Filled 2022-08-24: qty 2

## 2022-08-24 MED ORDER — PROPOFOL 500 MG/50ML IV EMUL
INTRAVENOUS | Status: DC | PRN
  Administered 2022-08-24: 125 ug/kg/min via INTRAVENOUS

## 2022-08-24 MED ORDER — OXYCODONE HCL 5 MG PO TABS
ORAL_TABLET | ORAL | Status: AC
  Filled 2022-08-24: qty 1

## 2022-08-24 MED ORDER — SODIUM CHLORIDE 0.9 % IV SOLN: INTRAVENOUS | Status: DC

## 2022-08-24 SURGICAL SUPPLY — 15 items
DRSG TELFA 3X8 NADH STRL (GAUZE/BANDAGES/DRESSINGS) IMPLANT
GLOVE BIO SURGEON STRL SZ7.5 (GLOVE) IMPLANT
GLOVE BIO SURGEON STRL SZ8 (GLOVE) IMPLANT
GOWN STRL REUS W/TWL LRG LVL3 (GOWN DISPOSABLE) IMPLANT
INST BIOPSY MAXCORE 18GX25 (NEEDLE) ×1 IMPLANT
INSTR BIOPSY MAXCORE 18GX20 (NEEDLE) IMPLANT
KIT TURNOVER CYSTO (KITS) ×1 IMPLANT
NDL SAFETY ECLIP 18X1.5 (MISCELLANEOUS) IMPLANT
NDL SPNL 22GX7 QUINCKE BK (NEEDLE) IMPLANT
NEEDLE SPNL 22GX7 QUINCKE BK (NEEDLE) IMPLANT
SLEEVE SCD COMPRESS KNEE MED (STOCKING) ×1 IMPLANT
SURGILUBE 2OZ TUBE FLIPTOP (MISCELLANEOUS) IMPLANT
SYR CONTROL 10ML LL (SYRINGE) IMPLANT
TOWEL OR 17X24 6PK STRL BLUE (TOWEL DISPOSABLE) IMPLANT
UNDERPAD 30X36 HEAVY ABSORB (UNDERPADS AND DIAPERS) ×1 IMPLANT

## 2022-08-24 NOTE — Transfer of Care (Signed)
Immediate Anesthesia Transfer of Care Note  Patient: Faraji Vardeman  Procedure(s) Performed: BIOPSY TRANSRECTAL ULTRASONIC PROSTATE (TUBP) (Rectum)  Patient Location: PACU  Anesthesia Type:MAC  Level of Consciousness: awake, alert , and oriented  Airway & Oxygen Therapy: Patient Spontanous Breathing and Patient connected to face mask oxygen  Post-op Assessment: Report given to RN and Post -op Vital signs reviewed and stable  Post vital signs: Reviewed and stable  Last Vitals:  Vitals Value Taken Time  BP    Temp    Pulse 65 08/24/22 0950  Resp 16 08/24/22 0950  SpO2 100 % 08/24/22 0950  Vitals shown include unvalidated device data.  Last Pain:  Vitals:   08/24/22 0727  TempSrc: Oral  PainSc: 0-No pain      Patients Stated Pain Goal: 5 (08/24/22 0727)  Complications: No notable events documented.

## 2022-08-24 NOTE — Anesthesia Preprocedure Evaluation (Signed)
Anesthesia Evaluation  Patient identified by MRN, date of birth, ID band Patient awake    Reviewed: Allergy & Precautions, H&P , NPO status , Patient's Chart, lab work & pertinent test results  Airway Mallampati: II  TM Distance: >3 FB Neck ROM: Full    Dental no notable dental hx.    Pulmonary neg pulmonary ROS, former smoker   Pulmonary exam normal breath sounds clear to auscultation       Cardiovascular hypertension, Normal cardiovascular exam Rhythm:Regular Rate:Normal     Neuro/Psych negative neurological ROS  negative psych ROS   GI/Hepatic negative GI ROS, Neg liver ROS,,,  Endo/Other  negative endocrine ROS    Renal/GU Renal InsufficiencyRenal disease  negative genitourinary   Musculoskeletal negative musculoskeletal ROS (+)    Abdominal   Peds negative pediatric ROS (+)  Hematology negative hematology ROS (+)   Anesthesia Other Findings   Reproductive/Obstetrics negative OB ROS                             Anesthesia Physical Anesthesia Plan  ASA: 3  Anesthesia Plan: MAC   Post-op Pain Management: Minimal or no pain anticipated   Induction: Intravenous  PONV Risk Score and Plan: 1 and Propofol infusion and Treatment may vary due to age or medical condition  Airway Management Planned: Simple Face Mask  Additional Equipment:   Intra-op Plan:   Post-operative Plan:   Informed Consent: I have reviewed the patients History and Physical, chart, labs and discussed the procedure including the risks, benefits and alternatives for the proposed anesthesia with the patient or authorized representative who has indicated his/her understanding and acceptance.     Dental advisory given  Plan Discussed with: CRNA and Surgeon  Anesthesia Plan Comments:        Anesthesia Quick Evaluation

## 2022-08-24 NOTE — Discharge Instructions (Signed)

## 2022-08-24 NOTE — Interval H&P Note (Signed)
History and Physical Interval Note:  08/24/2022 9:15 AM  Dylan Berg  has presented today for surgery, with the diagnosis of ELEVATED PSA.  The various methods of treatment have been discussed with the patient and family. After consideration of risks, benefits and other options for treatment, the patient has consented to  Procedure(s): BIOPSY TRANSRECTAL ULTRASONIC PROSTATE (TUBP) (N/A) as a surgical intervention.  The patient's history has been reviewed, patient examined, no change in status, stable for surgery.  I have reviewed the patient's chart and labs.  Questions were answered to the patient's satisfaction.     Dylan Berg

## 2022-08-24 NOTE — Op Note (Signed)
Preoperative diagnosis:  1.  Elevated PSA  Postoperative diagnosis: 1.  Same  Procedure(s): 1.  Transrectal ultrasound with prostate biopsy  Surgeon: Dr. Karoline Caldwell  Anesthesia: IV sedation  Complications: None  EBL: Minimal  Specimens: Prostate biopsy specimens  Disposition of specimens: To pathology  Intraoperative findings: Prostate volume noted to be 78 cc.  Sextant biopsies performed in standard fashion without difficulty  Indication: Patient is a 67 year old white male with elevated PSA up to 10.  Presents this time undergo transrectal ultrasound and prostate biopsy.  Description of procedure:  After obtaining form consent the patient was taken the major OR suite placed under IV sedation anesthesia.  He was turned in the right lateral decubitus position.  Digital rectal exam was performed revealed enlarged prostate without evidence of obvious nodularity.  Transrectal ultrasound probe was inserted without difficulty.  Following measurements were taken revealed 78 cc.  No definite hypoechoic areas were seen.  Prostate local block was performed utilizing subcapsular 2% lidocaine plain.  Sextant biopsies were then taken under ultrasound guidance from the right base, right mid, right apex, left base, left mid, left apex.  Prostate tissue was sent for pathologic evaluation.  No significant bleeding noted.  Transrectal ultrasound probe was removed.  Patient was awakened taken back to the recovery in stable condition no immediate complication from the procedure plan will be to see back in 1 week to review pathology results.

## 2022-08-24 NOTE — Anesthesia Postprocedure Evaluation (Signed)
Anesthesia Post Note  Patient: Dylan Berg  Procedure(s) Performed: BIOPSY TRANSRECTAL ULTRASONIC PROSTATE (TUBP) (Rectum)     Patient location during evaluation: PACU Anesthesia Type: MAC Level of consciousness: awake and alert Pain management: pain level controlled Vital Signs Assessment: post-procedure vital signs reviewed and stable Respiratory status: spontaneous breathing, nonlabored ventilation, respiratory function stable and patient connected to nasal cannula oxygen Cardiovascular status: stable and blood pressure returned to baseline Postop Assessment: no apparent nausea or vomiting Anesthetic complications: no  No notable events documented.  Last Vitals:  Vitals:   08/24/22 0727 08/24/22 0950  BP: 137/66 133/76  Pulse: (!) 109 68  Resp: 17 15  Temp: 36.6 C (!) 36.3 C  SpO2: 99% 100%    Last Pain:  Vitals:   08/24/22 0950  TempSrc:   PainSc: Asleep                 Masa Lubin S

## 2022-08-26 ENCOUNTER — Encounter (HOSPITAL_BASED_OUTPATIENT_CLINIC_OR_DEPARTMENT_OTHER): Payer: Self-pay | Admitting: Urology

## 2022-08-26 LAB — SURGICAL PATHOLOGY

## 2022-11-26 ENCOUNTER — Other Ambulatory Visit (HOSPITAL_COMMUNITY): Payer: Self-pay

## 2022-12-17 IMAGING — CR DG KNEE COMPLETE 4+V*R*
4 series · 4 of 4 positions shown · non-contrast
Comparison: None Available.

CLINICAL DATA: Trauma.  Blunt trauma

EXAM:
RIGHT KNEE - COMPLETE 4+ VIEW

[knee ap]
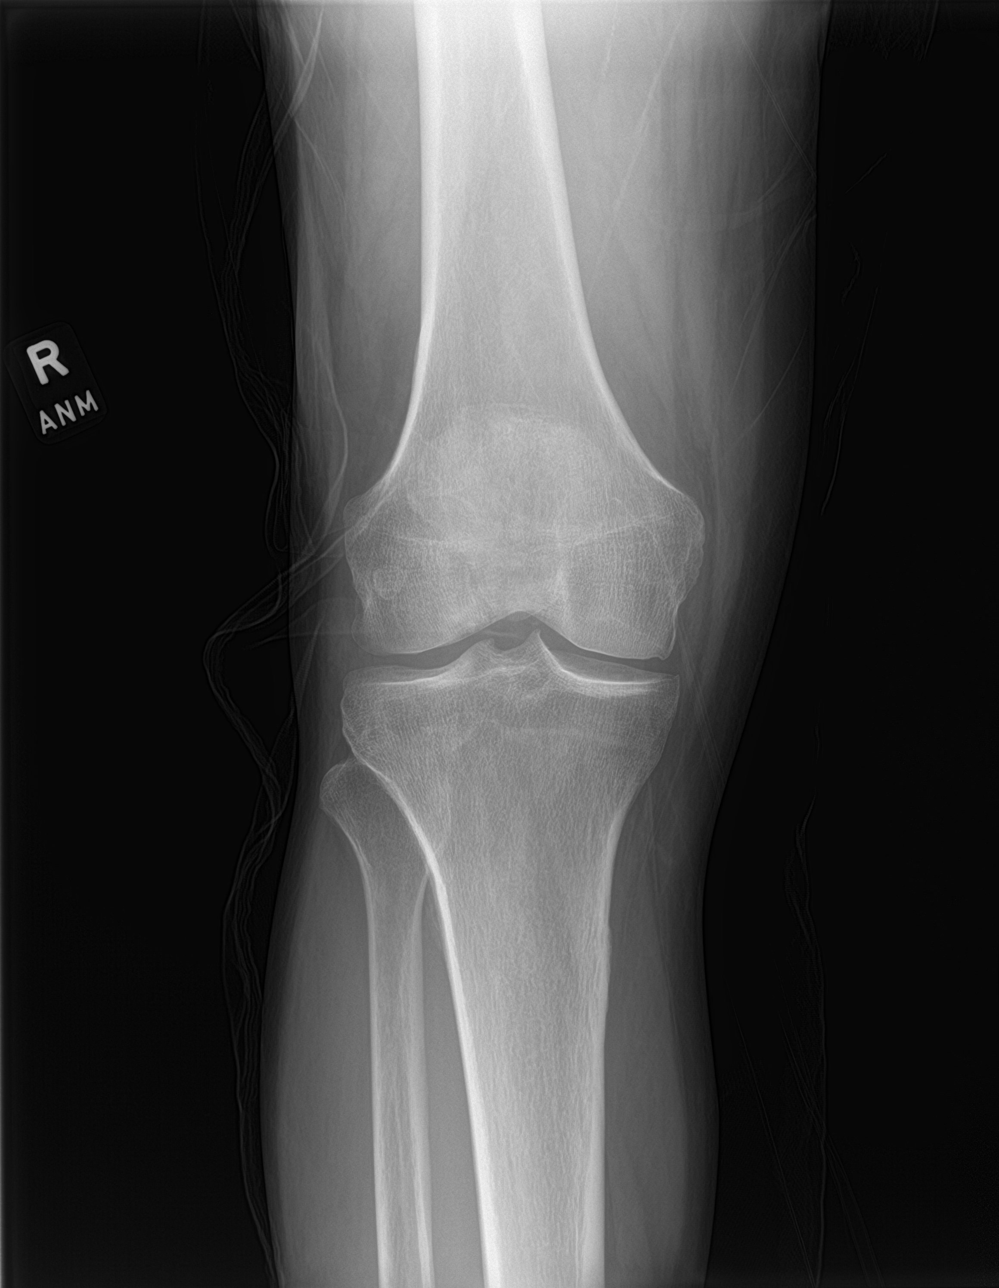

[knee obl (1 of 2)]
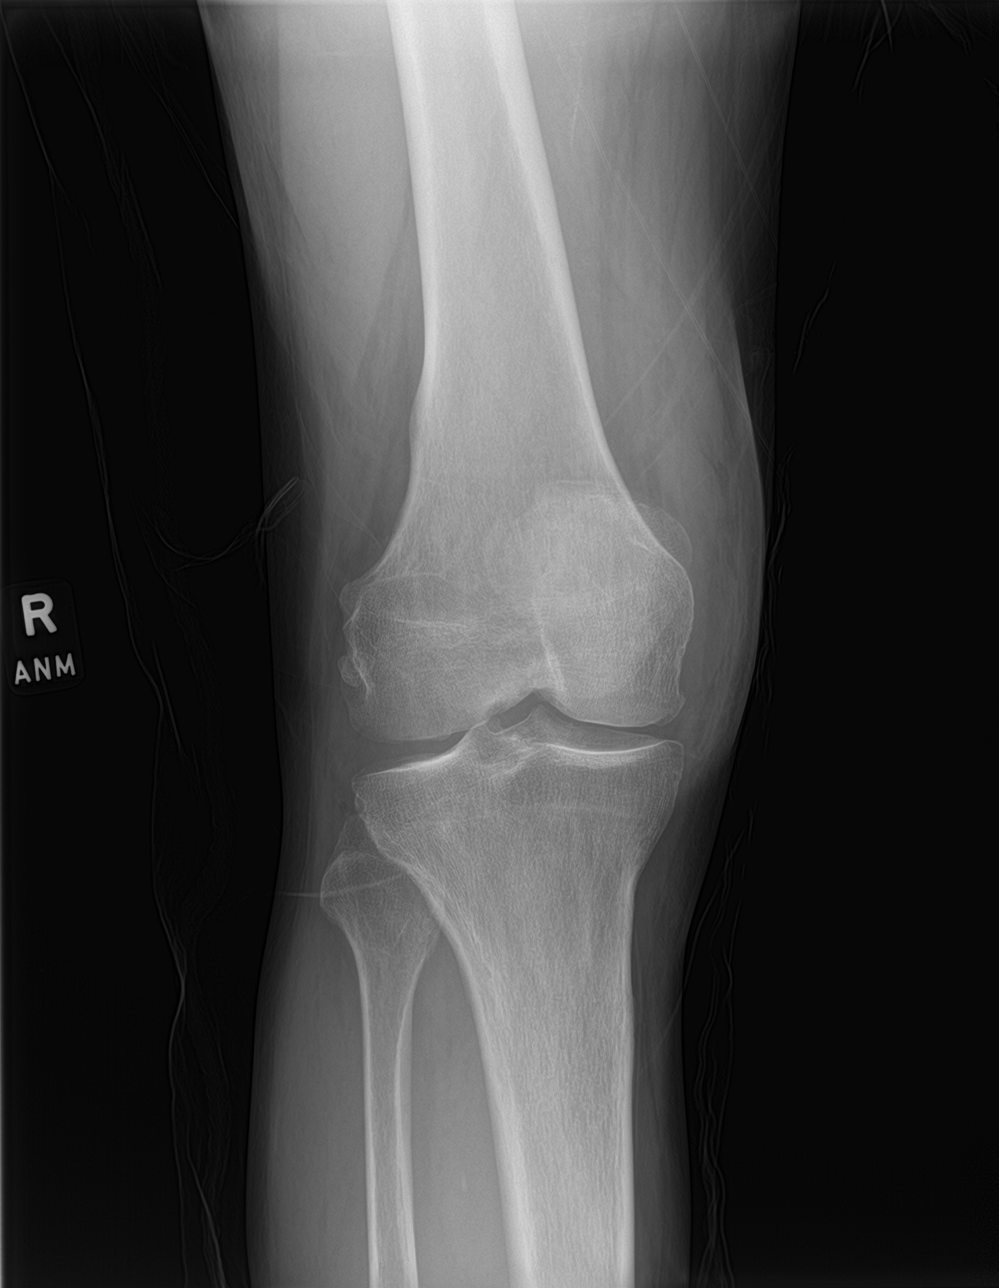

[knee obl (2 of 2)]
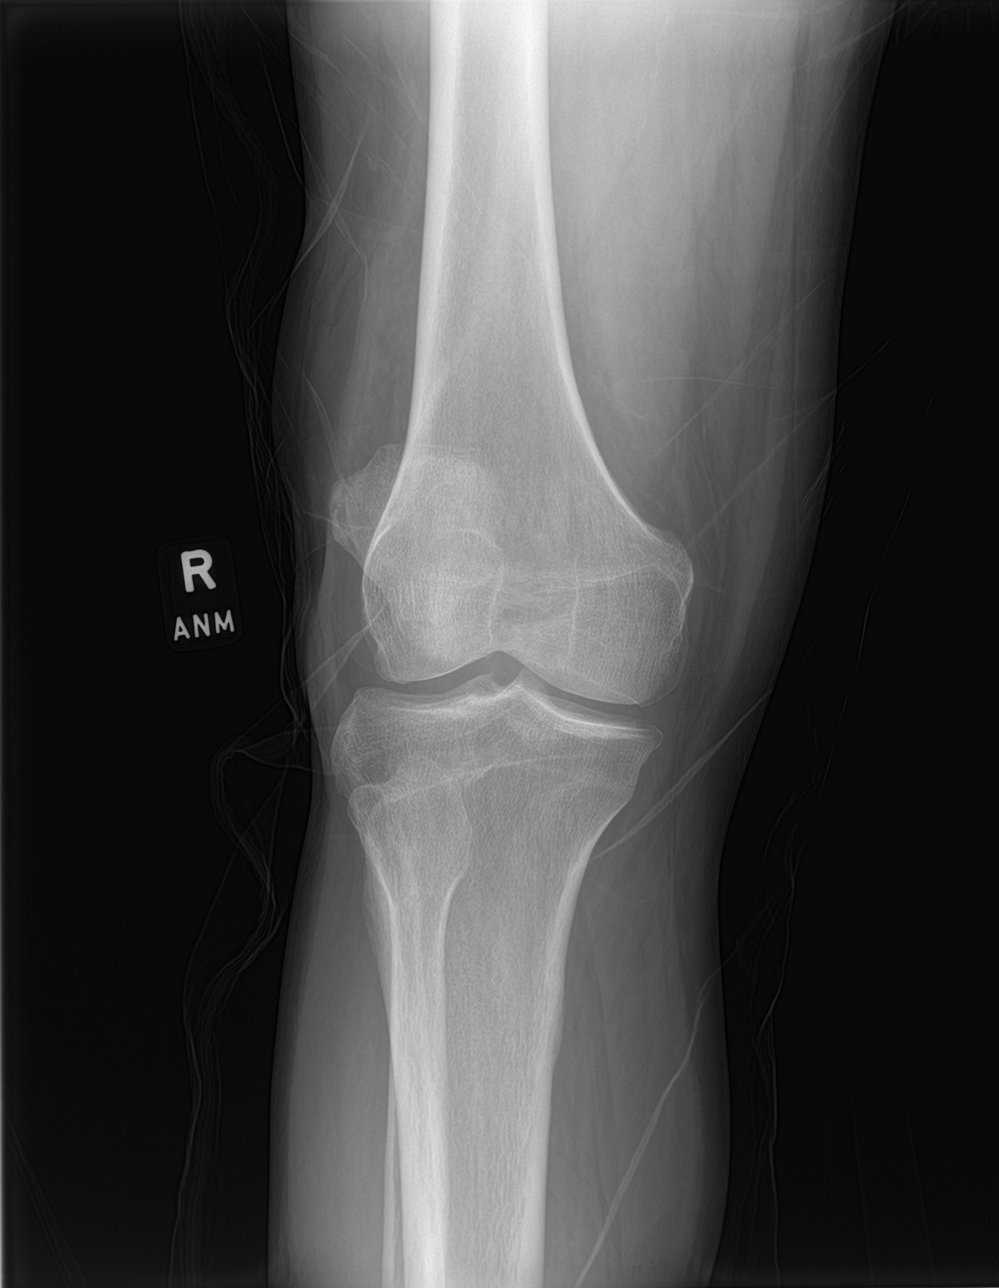

[knee lat]
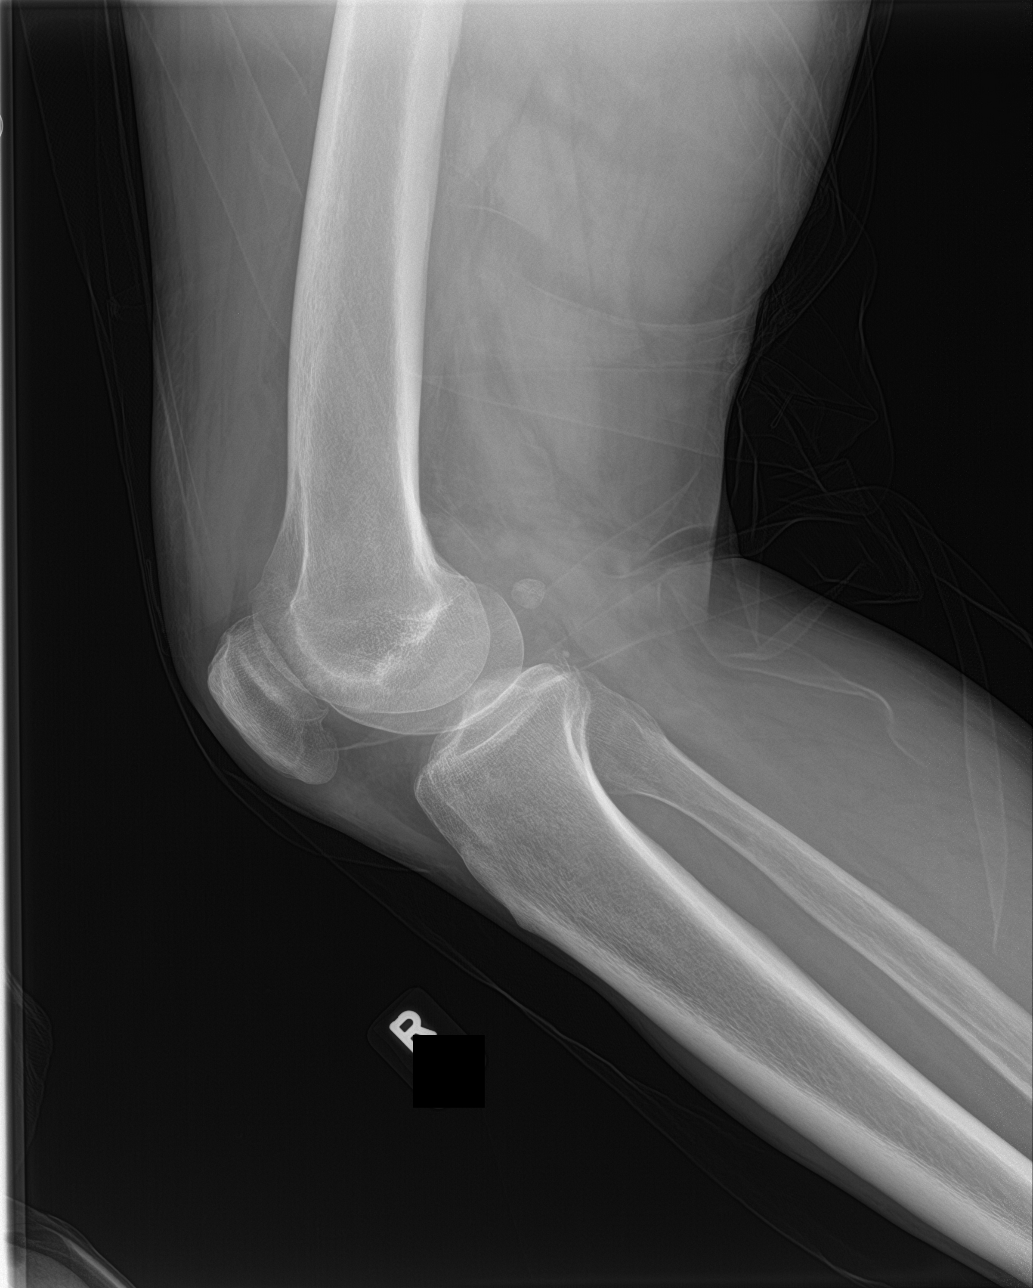

[4 of 4 positions shown; findings below may reference images not displayed]

FINDINGS: No fracture of the proximal tibia or distal femur. Patella is
normal. No joint effusion.
IMPRESSION: No fracture or dislocation.

## 2022-12-17 IMAGING — CR DG SHOULDER 2+V*R*
3 series · 3 of 3 positions shown · non-contrast
Comparison: None Available.

CLINICAL DATA: Right shoulder and knee injury at work.

EXAM:
RIGHT SHOULDER - 2+ VIEW

[shoulder grashey]
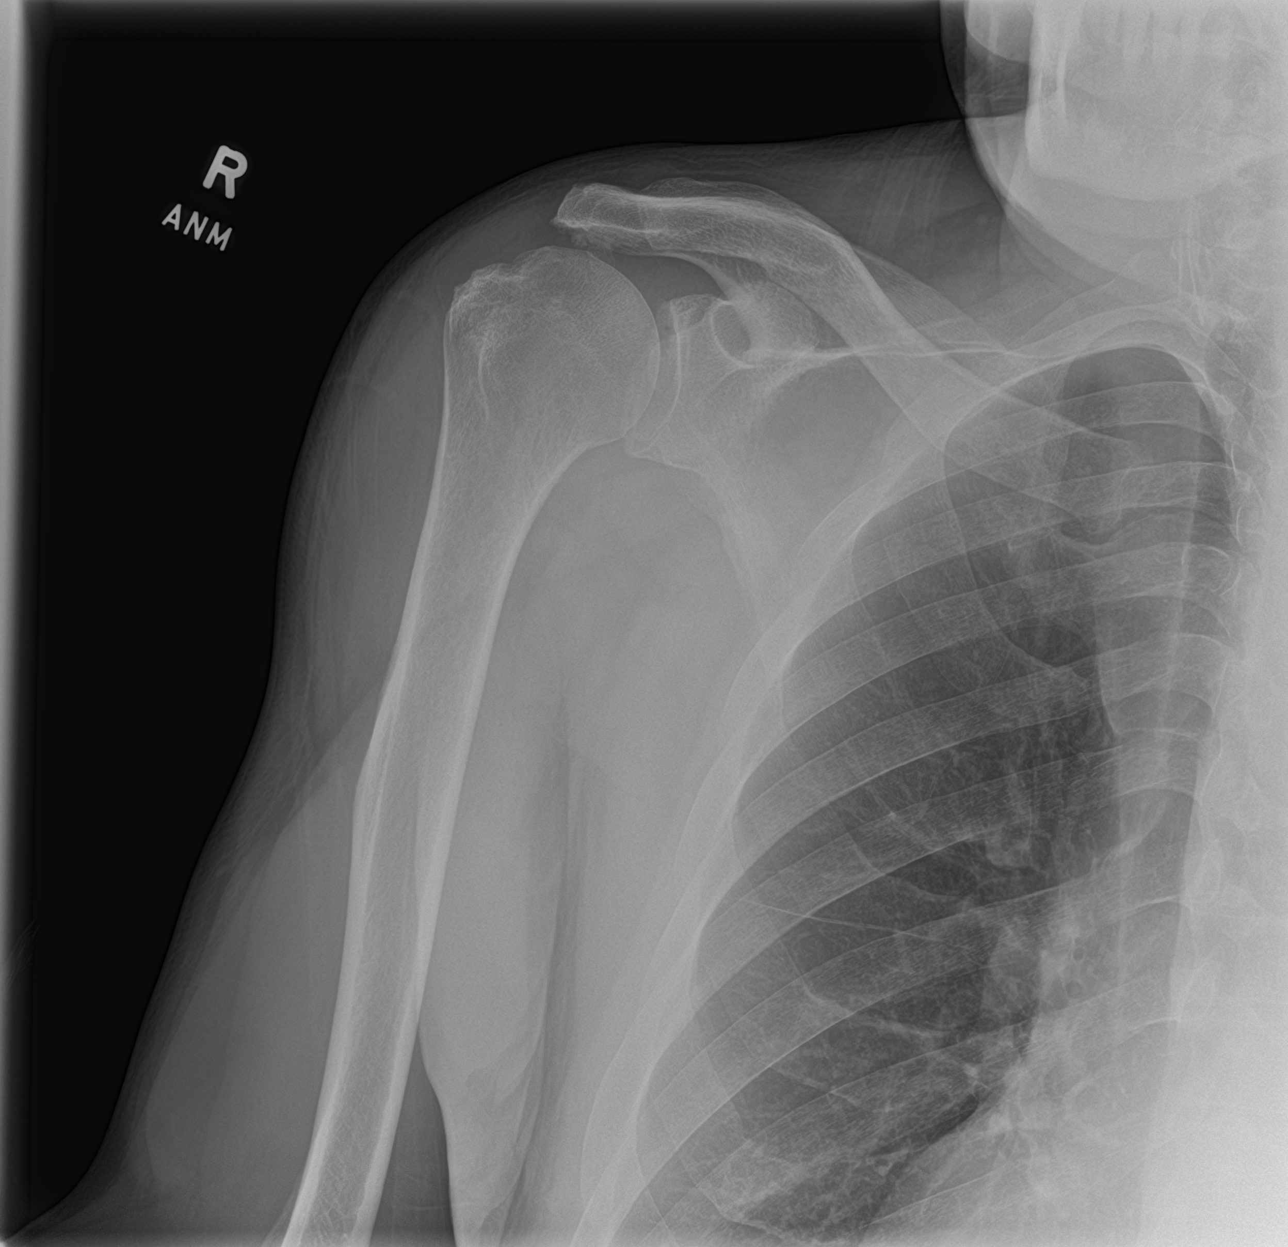

[shoulder y view]
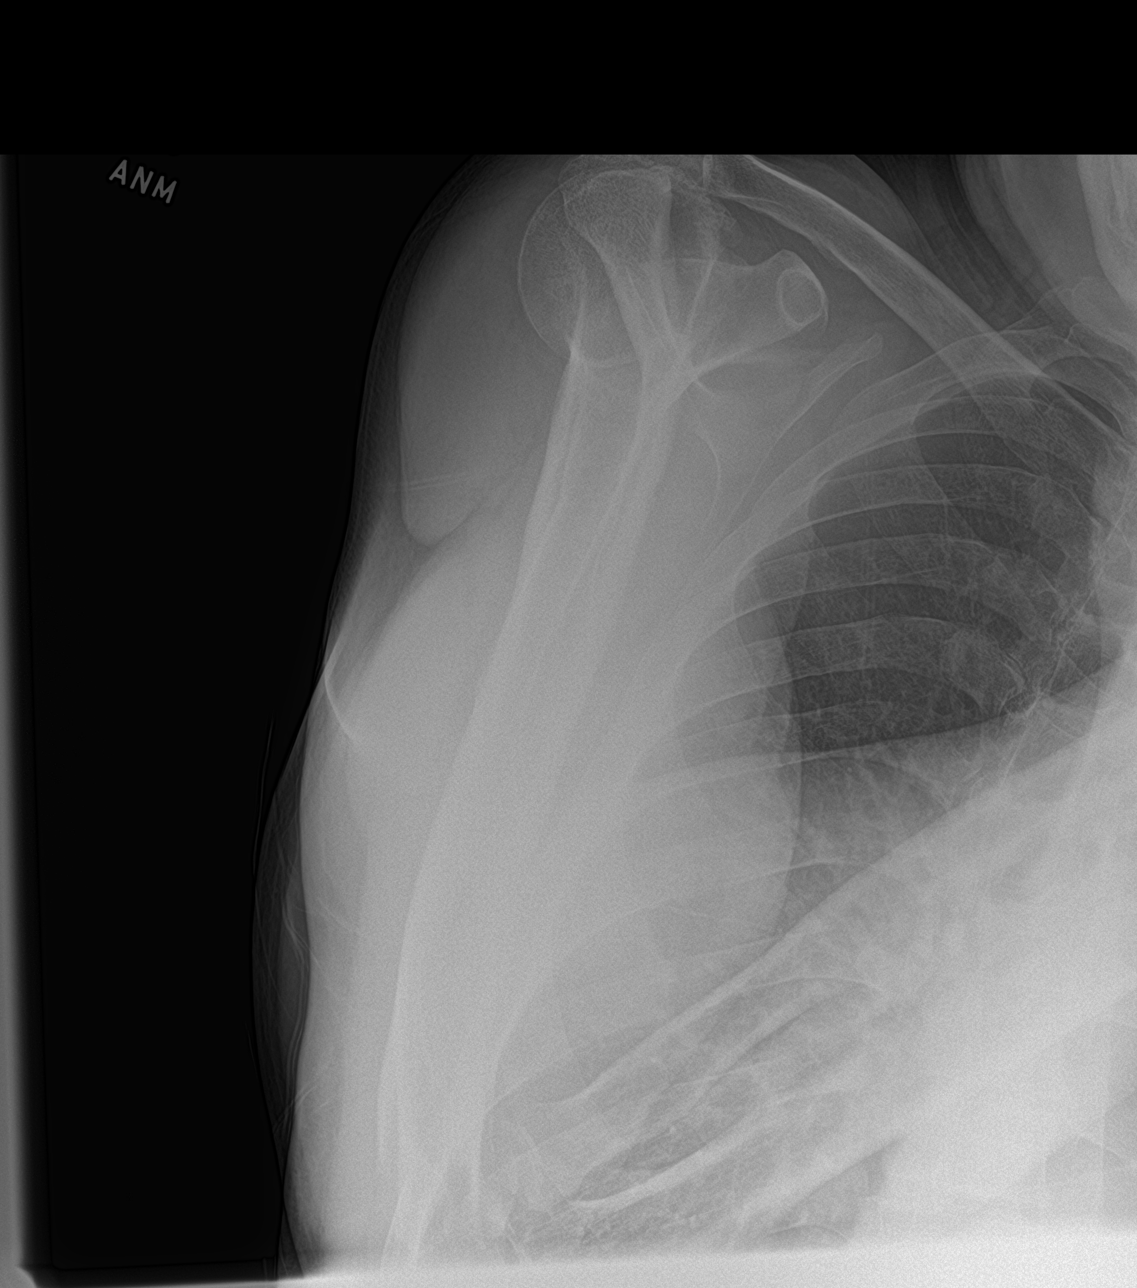

[shoulder ap neutral]
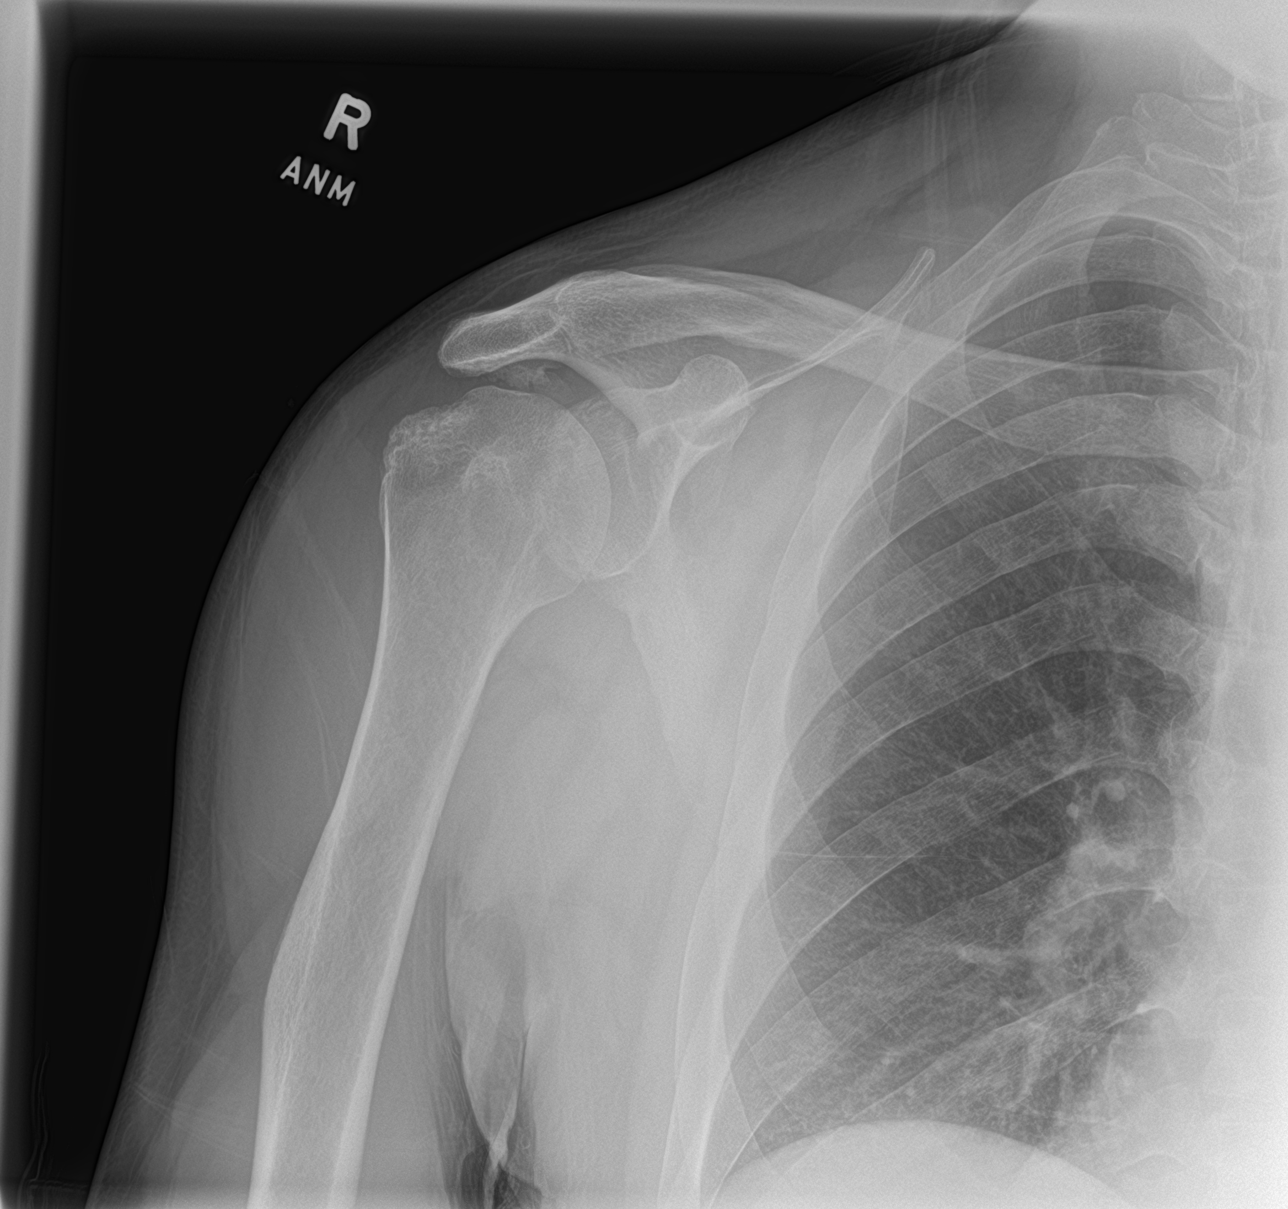

[3 of 3 positions shown; findings below may reference images not displayed]

FINDINGS: No evidence for an acute fracture. No evidence for shoulder
separation or dislocation. Calcific tendinitis of the rotator cuff
evident. Likely undersurface spur of the acromion.
IMPRESSION: 1. No acute bony abnormality.
2. Calcific tendinitis of the rotator cuff. Likely undersurface spur
of the acromion.

## 2023-03-28 ENCOUNTER — Other Ambulatory Visit: Payer: Self-pay | Admitting: Physician Assistant

## 2023-03-28 DIAGNOSIS — Z720 Tobacco use: Secondary | ICD-10-CM

## 2023-03-28 DIAGNOSIS — Z122 Encounter for screening for malignant neoplasm of respiratory organs: Secondary | ICD-10-CM

## 2023-04-06 LAB — LAB REPORT - SCANNED
A1c: 5.5
EGFR: 51

## 2023-04-12 ENCOUNTER — Encounter: Payer: Self-pay | Admitting: Physician Assistant

## 2023-04-19 ENCOUNTER — Ambulatory Visit
Admission: RE | Admit: 2023-04-19 | Discharge: 2023-04-19 | Disposition: A | Discharge status: Home / Self Care | Payer: Medicare HMO | Source: Ambulatory Visit | Attending: Physician Assistant | Admitting: Physician Assistant

## 2023-04-19 DIAGNOSIS — Z122 Encounter for screening for malignant neoplasm of respiratory organs: Secondary | ICD-10-CM

## 2023-04-19 DIAGNOSIS — Z720 Tobacco use: Secondary | ICD-10-CM

## 2023-06-13 ENCOUNTER — Other Ambulatory Visit: Payer: Self-pay | Admitting: Urology

## 2023-06-13 DIAGNOSIS — C61 Malignant neoplasm of prostate: Secondary | ICD-10-CM

## 2023-08-03 NOTE — Progress Notes (Signed)
 Cardiology Office Note:    Date:  08/05/2023   ID:  Dylan Berg, DOB 04/21/1955, MRN [id number]  PCP:  Leanora Prophet, PA-C   [facility] Providers Cardiologist:  Dorothye Gathers, MD Cardiology APP:  Lamond Pilot, PA     Referring MD: Vergil Glasser, MD   Chief Complaint  Patient presents with   Follow-up    HTN    History of Present Illness:    Dylan Berg is a 68 y.o. male with a hx of HTN, CKD, PACs (11% burden on heart monitor in 2023), and coronary artery calcification on CT chest performed for lung cancer screening.  He also has gout in multiple sites, history of tobacco abuse, and secondary hyperparathyroidism of renal origin.  Patient was recently seen by his PCP in 06/22/2023 with bilateral foot swelling after his antibiotic.  He was referred back to cardiology. He tells me this swelling was due to a gout flare. He is currently having no cardiac issues. No swelling, SOB, chest pain, denies palpitations.   Dr. Verdia Glad  checked his cholesterol last month, I do not see these results.   Past Medical History:  Diagnosis Date   Atrial bigeminy    BPH with elevated PSA    urologist--- dr Annitta Kindler   Chronic bronchitis, unspecified chronic bronchitis type (HCC)    CKD (chronic kidney disease), stage III [facility])    nephrologist-- dr v. singh   GERD (gastroesophageal reflux disease)    History of gout    History of paroxysmal supraventricular tachycardia    s/p  RFA 07-19-2014 in [place]   Hypertension    OA (osteoarthritis)    PAC (premature atrial contraction)    cardiologist--- dr Renna Cary   Wears glasses     Past Surgical History:  Procedure Laterality Date   CARDIAC ELECTROPHYSIOLOGY STUDY AND ABLATION  07/19/2014   [place];   RFA of AVN slow pathway   COLONOSCOPY  2010   POSTERIOR FUSION LUMBAR SPINE  08/14/2012   [place];   left L4-5 and resection synoivial cyst   POSTERIOR FUSION LUMBAR SPINE, MINIMALLY INVASIVE  04/22/2015    [place];   [employer] pedcal screw hardware /  left L5-S1 disectomy, decompression, fusion   PROSTATE BIOPSY N/A 08/24/2022   Procedure: BIOPSY TRANSRECTAL ULTRASONIC PROSTATE (TUBP);  Surgeon: Sherlyn Ditto, MD;  Location: [facility];  Service: Urology;  Laterality: N/A;   REVERSE SHOULDER ARTHROPLASTY Right 04/16/2022   Procedure: REVERSE SHOULDER ARTHROPLASTY;  Surgeon: Winston Hawking, MD;  Location: [facility];  Service: Orthopedics;  Laterality: Right;  120 min choice and general   ROTATOR CUFF REPAIR Left    approx 2010   SATURATION BIOPSY OF PROSTATE  12/21/2016   in [place] ;  w/ anesthesia   TONSILLECTOMY     age 10   UMBILICAL HERNIA REPAIR  2012    Current Medications: Current Meds  Medication Sig   albuterol  (VENTOLIN  HFA) 108 (90 Base) MCG/ACT inhaler Inhale 1-2 puffs into the lungs every 6 (six) hours as needed for wheezing or shortness of breath.   atorvastatin  (LIPITOR) 10 MG tablet Take 10 mg by mouth daily.   famotidine  (PEPCID ) 20 MG tablet Take 20 mg by mouth 2 (two) times daily.   FLOVENT HFA 110 MCG/ACT inhaler Inhale 1 puff into the lungs in the morning and at bedtime.   gabapentin (NEURONTIN) 300 MG capsule Take 300 mg by mouth 2 (two) times daily.  methocarbamol  (ROBAXIN ) 500 MG tablet Take 1 tablet (500 mg total) by mouth every 8 (eight) hours as needed for muscle spasms.   tamsulosin  (FLOMAX ) 0.4 MG CAPS capsule Take 1 capsule (0.4 mg total) by mouth daily.   [DISCONTINUED] amLODipine  (NORVASC ) 10 MG tablet Take 10 mg by mouth daily.   [DISCONTINUED] hydrochlorothiazide  (HYDRODIURIL ) 25 MG tablet Take 25 mg by mouth daily.     Allergies:   Patient has no known allergies.   Social History   Socioeconomic History   Marital status: Single    Spouse name: Not on file   Number of children: Not on file   Years of education: Not on file   Highest education level: Not on file  Occupational History   Not on file  Tobacco Use    Smoking status: Former    Current packs/day: 0.00    Average packs/day: 0.3 packs/day for 40.0 years (10.0 ttl pk-yrs)    Types: Cigarettes    Start date: 04/04/1982    Quit date: 04/04/2022    Years since quitting: 1.3   Smokeless tobacco: Never  Vaping Use   Vaping status: Never Used  Substance and Sexual Activity   Alcohol use: Yes    Comment: occasional   Drug use: Yes    Types: Marijuana    Comment: 08-12-2022  per pt smokes daily   Sexual activity: Yes  Other Topics Concern   Not on file  Social History Narrative   Not on file   Social Drivers of Health   Financial Resource Strain: At Risk (04/25/2023)   Received from [employer]    Financial Resource Strain: 2  Food Insecurity: At Risk (04/25/2023)   Received from [employer]    Food: 2  Transportation Needs: At Risk (04/25/2023)   Received from [employer] [facility]    Transportation: 2  Physical Activity: Not on File (07/23/2021)   Received from [place], [place]   Physical Activity    Physical Activity: 0  Stress: Not on File (07/23/2021)   Received from [facility], [place]   Stress    Stress: 0  Social Connections: Not on File (12/13/2022)   Received from [facility]   Social Connections    Connectedness: 0     Family History: The patient's family history is negative for Heart attack.  ROS:   Please see the history of present illness.     All other systems reviewed and are negative.  EKGs/Labs/Other Studies Reviewed:    The following studies were reviewed today:  EKG Interpretation Date/Time:  Friday Aug 05 2023 07:55:00 EDT Ventricular Rate:  81 PR Interval:  142 QRS Duration:  82 QT Interval:  378 QTC Calculation: 439 R Axis:   56  Text Interpretation: Sinus rhythm with Premature atrial complexes in a pattern of bigeminy No previous ECGs available Confirmed by Marcie Sever ([id number]) on 08/05/2023 7:59:53 AM    Recent Labs: 08/24/2022: BUN 38; Creatinine, Ser 1.80;  Hemoglobin 13.3; Potassium 5.2; Sodium 141  Recent Lipid Panel No results found for: "CHOL", "TRIG", "HDL", "CHOLHDL", "VLDL", "LDLCALC", "LDLDIRECT"   Risk Assessment/Calculations:                Physical Exam:    VS:  BP 110/60   Pulse 80   Ht 5\' 11"  (1.803 m)   Wt 195 lb 9.6 oz (88.7 kg)   SpO2 98%   BMI 27.28 kg/m     Wt Readings from  Last 3 Encounters:  08/05/23 195 lb 9.6 oz (88.7 kg)  08/24/22 198 lb 11.2 oz (90.1 kg)  04/16/22 210 lb 3.2 oz (95.3 kg)     GEN:  Well nourished, well developed in no acute distress HEENT: Normal NECK: No JVD; No carotid bruits LYMPHATICS: No lymphadenopathy CARDIAC: RRR, no murmurs, rubs, gallops RESPIRATORY:  Clear to auscultation without rales, wheezing or rhonchi  ABDOMEN: Soft, non-tender, non-distended MUSCULOSKELETAL:  No edema; No deformity  SKIN: Warm and dry NEUROLOGIC:  Alert and oriented x 3 PSYCHIATRIC:  Normal affect   ASSESSMENT:    1. Primary hypertension   2. PAC (premature atrial contraction)   3. CKD stage G3a/A1, GFR 45-59 and albumin creatinine ratio <30 mg/g (HCC)   4. Pure hypercholesterolemia    PLAN:    In order of problems listed above:  Hypertension - currently on 10 mg amlodipine  and 25 mg hydrochlorothiazide  - BP well controlled, continue these without changes.  - he states he had blood work last month   PACs - EKG today with sinus with atrial bigeminy - he is asymptomatic, no syncope - can consider BB in the future if he becomes symptomatic   CKD  - unclear baseline, will get labs faxed   Hyperlipidemia with LDL goal < 70 - will get labs from PCP - continue 10 mg lipitor   Follow up in 1 year (me or Dr. Renna Cary)            Medication Adjustments/Labs and Tests Ordered: Current medicines are reviewed at length with the patient today.  Concerns regarding medicines are outlined above.  Orders Placed This Encounter  Procedures   EKG 12-Lead   Meds ordered this encounter   Medications   hydrochlorothiazide  (HYDRODIURIL ) 25 MG tablet    Sig: Take 1 tablet (25 mg total) by mouth daily.    Dispense:  90 tablet    Refill:  3   amLODipine  (NORVASC ) 10 MG tablet    Sig: Take 1 tablet (10 mg total) by mouth daily.    Dispense:  90 tablet    Refill:  3    Patient Instructions  Medication Instructions:  No changes *If you need a refill on your cardiac medications before your next appointment, please call your pharmacy*  Lab Work: No labs  Testing/Procedures: No testing  Follow-Up: At [facility], you and your health needs are our priority.  As part of our continuing mission to provide you with exceptional heart care, our providers are all part of one team.  This team includes your primary Cardiologist (physician) and Advanced Practice Providers or APPs (Physician Assistants and Nurse Practitioners) who all work together to provide you with the care you need, when you need it.  Your next appointment:   1 year(s)  Provider:   Dorothye Gathers, MD or Marcie Sever, PA-C  We recommend signing up for the patient portal called "MyChart".  Sign up information is provided on this After Visit Summary.  MyChart is used to connect with patients for Virtual Visits (Telemedicine).  Patients are able to view lab/test results, encounter notes, upcoming appointments, etc.  Non-urgent messages can be sent to your provider as well.   To learn more about what you can do with MyChart, go to [email or web address].    Signed, Lamond Pilot, [place]  08/05/2023 8:35 AM    [facility]

## 2023-08-05 ENCOUNTER — Ambulatory Visit: Attending: Physician Assistant | Admitting: Physician Assistant

## 2023-08-05 ENCOUNTER — Encounter: Payer: Self-pay | Admitting: Physician Assistant

## 2023-08-05 VITALS — BP 110/60 | HR 80 | Ht 71.0 in | Wt 195.6 lb

## 2023-08-05 DIAGNOSIS — I491 Atrial premature depolarization: Secondary | ICD-10-CM

## 2023-08-05 DIAGNOSIS — N1831 Chronic kidney disease, stage 3a: Secondary | ICD-10-CM | POA: Diagnosis not present

## 2023-08-05 DIAGNOSIS — I10 Essential (primary) hypertension: Secondary | ICD-10-CM

## 2023-08-05 DIAGNOSIS — E7800 Pure hypercholesterolemia, unspecified: Secondary | ICD-10-CM

## 2023-08-05 MED ORDER — HYDROCHLOROTHIAZIDE 25 MG PO TABS: 25.0000 mg | ORAL_TABLET | Freq: Every day | ORAL | 3 refills | Status: AC

## 2023-08-05 MED ORDER — AMLODIPINE BESYLATE 10 MG PO TABS: 10.0000 mg | ORAL_TABLET | Freq: Every day | ORAL | 3 refills | Status: AC

## 2023-08-05 NOTE — Patient Instructions (Signed)
 Medication Instructions:  No changes *If you need a refill on your cardiac medications before your next appointment, please call your pharmacy*  Lab Work: No labs  Testing/Procedures: No testing  Follow-Up: At [facility], you and your health needs are our priority.  As part of our continuing mission to provide you with exceptional heart care, our providers are all part of one team.  This team includes your primary Cardiologist (physician) and Advanced Practice Providers or APPs (Physician Assistants and Nurse Practitioners) who all work together to provide you with the care you need, when you need it.  Your next appointment:   1 year(s)  Provider:   Dorothye Gathers, MD or Marcie Sever, PA-C  We recommend signing up for the patient portal called "MyChart".  Sign up information is provided on this After Visit Summary.  MyChart is used to connect with patients for Virtual Visits (Telemedicine).  Patients are able to view lab/test results, encounter notes, upcoming appointments, etc.  Non-urgent messages can be sent to your provider as well.   To learn more about what you can do with MyChart, go to [email or web address].

## 2023-08-16 ENCOUNTER — Inpatient Hospital Stay: Admission: RE | Admit: 2023-08-16 | Source: Ambulatory Visit

## 2023-11-14 ENCOUNTER — Other Ambulatory Visit: Payer: Self-pay | Admitting: Urology

## 2023-11-14 DIAGNOSIS — C61 Malignant neoplasm of prostate: Secondary | ICD-10-CM

## 2023-11-18 ENCOUNTER — Other Ambulatory Visit

## 2023-12-24 ENCOUNTER — Ambulatory Visit
Admission: RE | Admit: 2023-12-24 | Discharge: 2023-12-24 | Disposition: A | Discharge status: Home / Self Care | Source: Ambulatory Visit | Attending: Urology | Admitting: Urology

## 2023-12-24 DIAGNOSIS — C61 Malignant neoplasm of prostate: Secondary | ICD-10-CM

## 2023-12-24 MED ORDER — GADOPICLENOL 0.5 MMOL/ML IV SOLN
10.0000 mL | Freq: Once | INTRAVENOUS | Status: AC | PRN
  Administered 2023-12-24: 10 mL via INTRAVENOUS
